# Patient Record
Sex: Male | Born: 1945
Health system: Southern US, Community
[De-identification: ages and names within clinical notes are randomized; demographics above are authoritative.]

## PROBLEM LIST (undated history)

## (undated) DIAGNOSIS — Z950 Presence of cardiac pacemaker: Secondary | ICD-10-CM

## (undated) DIAGNOSIS — R001 Bradycardia, unspecified: Secondary | ICD-10-CM

## (undated) DIAGNOSIS — K219 Gastro-esophageal reflux disease without esophagitis: Secondary | ICD-10-CM

## (undated) DIAGNOSIS — D649 Anemia, unspecified: Secondary | ICD-10-CM

## (undated) DIAGNOSIS — L719 Rosacea, unspecified: Secondary | ICD-10-CM

## (undated) DIAGNOSIS — I495 Sick sinus syndrome: Secondary | ICD-10-CM

## (undated) DIAGNOSIS — M5020 Other cervical disc displacement, unspecified cervical region: Secondary | ICD-10-CM

## (undated) DIAGNOSIS — E785 Hyperlipidemia, unspecified: Secondary | ICD-10-CM

## (undated) DIAGNOSIS — S62309A Unspecified fracture of unspecified metacarpal bone, initial encounter for closed fracture: Secondary | ICD-10-CM

## (undated) DIAGNOSIS — M1990 Unspecified osteoarthritis, unspecified site: Secondary | ICD-10-CM

## (undated) DIAGNOSIS — K449 Diaphragmatic hernia without obstruction or gangrene: Secondary | ICD-10-CM

## (undated) DIAGNOSIS — K220 Achalasia of cardia: Secondary | ICD-10-CM

## (undated) DIAGNOSIS — Z8719 Personal history of other diseases of the digestive system: Secondary | ICD-10-CM

## (undated) HISTORY — PX: MYOTOMY: SHX1013

## (undated) HISTORY — DX: Anemia, unspecified: D64.9

## (undated) HISTORY — DX: Personal history of other diseases of the digestive system: Z87.19

## (undated) HISTORY — DX: Gastro-esophageal reflux disease without esophagitis: K21.9

## (undated) HISTORY — PX: TONSILLECTOMY: SUR1361

## (undated) HISTORY — DX: Other cervical disc displacement, unspecified cervical region: M50.20

## (undated) HISTORY — PX: CERVICAL FUSION: SHX112

## (undated) HISTORY — PX: VARICOCELE EXCISION: SUR582

## (undated) HISTORY — DX: Diaphragmatic hernia without obstruction or gangrene: K44.9

## (undated) HISTORY — DX: Bradycardia, unspecified: R00.1

## (undated) HISTORY — DX: Rosacea, unspecified: L71.9

## (undated) HISTORY — PX: INSERT / REPLACE / REMOVE PACEMAKER: SUR710

## (undated) HISTORY — DX: Hyperlipidemia, unspecified: E78.5

## (undated) HISTORY — DX: Unspecified fracture of unspecified metacarpal bone, initial encounter for closed fracture: S62.309A

## (undated) HISTORY — PX: HERNIA REPAIR: SHX51

---

## 1975-11-27 HISTORY — PX: NISSEN FUNDOPLICATION: SHX2091

## 2001-02-23 ENCOUNTER — Encounter: Payer: Self-pay | Admitting: Emergency Medicine

## 2001-02-23 ENCOUNTER — Emergency Department (HOSPITAL_COMMUNITY): Admission: EM | Admit: 2001-02-23 | Discharge: 2001-02-23 | Payer: Self-pay | Admitting: Emergency Medicine

## 2002-05-25 ENCOUNTER — Encounter (INDEPENDENT_AMBULATORY_CARE_PROVIDER_SITE_OTHER): Payer: Self-pay | Admitting: Specialist

## 2002-05-25 ENCOUNTER — Ambulatory Visit (HOSPITAL_COMMUNITY): Admission: RE | Admit: 2002-05-25 | Discharge: 2002-05-25 | Payer: Self-pay | Admitting: Gastroenterology

## 2002-11-23 ENCOUNTER — Inpatient Hospital Stay (HOSPITAL_COMMUNITY): Admission: EM | Admit: 2002-11-23 | Discharge: 2002-11-25 | Payer: Self-pay | Admitting: Emergency Medicine

## 2002-11-23 ENCOUNTER — Encounter: Payer: Self-pay | Admitting: Emergency Medicine

## 2003-05-07 ENCOUNTER — Ambulatory Visit (HOSPITAL_COMMUNITY): Admission: RE | Admit: 2003-05-07 | Discharge: 2003-05-07 | Payer: Self-pay | Admitting: Gastroenterology

## 2003-05-07 ENCOUNTER — Encounter: Payer: Self-pay | Admitting: Gastroenterology

## 2003-12-09 ENCOUNTER — Inpatient Hospital Stay (HOSPITAL_COMMUNITY): Admission: EM | Admit: 2003-12-09 | Discharge: 2003-12-10 | Payer: Self-pay | Admitting: Emergency Medicine

## 2005-01-12 ENCOUNTER — Observation Stay (HOSPITAL_COMMUNITY): Admission: AD | Admit: 2005-01-12 | Discharge: 2005-01-13 | Payer: Self-pay | Admitting: *Deleted

## 2006-10-24 ENCOUNTER — Observation Stay (HOSPITAL_COMMUNITY): Admission: EM | Admit: 2006-10-24 | Discharge: 2006-10-25 | Payer: Self-pay | Admitting: Emergency Medicine

## 2006-10-24 ENCOUNTER — Ambulatory Visit: Payer: Self-pay | Admitting: Infectious Diseases

## 2007-06-10 ENCOUNTER — Encounter (HOSPITAL_COMMUNITY): Admission: RE | Admit: 2007-06-10 | Discharge: 2007-08-01 | Payer: Self-pay | Admitting: Gastroenterology

## 2007-09-10 ENCOUNTER — Encounter (HOSPITAL_COMMUNITY): Admission: RE | Admit: 2007-09-10 | Discharge: 2007-10-29 | Payer: Self-pay | Admitting: Gastroenterology

## 2007-11-15 ENCOUNTER — Observation Stay (HOSPITAL_COMMUNITY): Admission: EM | Admit: 2007-11-15 | Discharge: 2007-11-17 | Payer: Self-pay | Admitting: Emergency Medicine

## 2007-11-28 ENCOUNTER — Encounter: Admission: RE | Admit: 2007-11-28 | Discharge: 2007-11-28 | Payer: Self-pay | Admitting: General Surgery

## 2007-12-02 ENCOUNTER — Ambulatory Visit (HOSPITAL_BASED_OUTPATIENT_CLINIC_OR_DEPARTMENT_OTHER): Admission: RE | Admit: 2007-12-02 | Discharge: 2007-12-02 | Payer: Self-pay | Admitting: General Surgery

## 2010-12-05 ENCOUNTER — Inpatient Hospital Stay (HOSPITAL_COMMUNITY): Admission: EM | Admit: 2010-12-05 | Discharge: 2010-12-06 | Payer: Self-pay | Source: Home / Self Care

## 2010-12-06 ENCOUNTER — Encounter (INDEPENDENT_AMBULATORY_CARE_PROVIDER_SITE_OTHER): Payer: Self-pay

## 2010-12-06 ENCOUNTER — Encounter (INDEPENDENT_AMBULATORY_CARE_PROVIDER_SITE_OTHER): Payer: Self-pay | Admitting: Internal Medicine

## 2010-12-11 LAB — DIFFERENTIAL
Basophils Absolute: 0.1 10*3/uL (ref 0.0–0.1)
Basophils Relative: 1 % (ref 0–1)
Eosinophils Absolute: 0.1 10*3/uL (ref 0.0–0.7)
Eosinophils Relative: 2 % (ref 0–5)
Lymphocytes Relative: 18 % (ref 12–46)
Lymphs Abs: 1.1 10*3/uL (ref 0.7–4.0)
Monocytes Absolute: 0.5 10*3/uL (ref 0.1–1.0)
Monocytes Relative: 8 % (ref 3–12)
Neutro Abs: 4.3 10*3/uL (ref 1.7–7.7)
Neutrophils Relative %: 71 % (ref 43–77)
Smear Review: ADEQUATE

## 2010-12-11 LAB — CBC
HCT: 26.2 % — ABNORMAL LOW (ref 39.0–52.0)
HCT: 27.7 % — ABNORMAL LOW (ref 39.0–52.0)
Hemoglobin: 7.4 g/dL — ABNORMAL LOW (ref 13.0–17.0)
Hemoglobin: 8.5 g/dL — ABNORMAL LOW (ref 13.0–17.0)
MCH: 19.1 pg — ABNORMAL LOW (ref 26.0–34.0)
MCH: 21.4 pg — ABNORMAL LOW (ref 26.0–34.0)
MCHC: 28.2 g/dL — ABNORMAL LOW (ref 30.0–36.0)
MCHC: 30.7 g/dL (ref 30.0–36.0)
MCV: 67.7 fL — ABNORMAL LOW (ref 78.0–100.0)
MCV: 69.8 fL — ABNORMAL LOW (ref 78.0–100.0)
Platelets: 364 10*3/uL (ref 150–400)
Platelets: 292 10*3/uL (ref 150–400)
RBC: 3.87 MIL/uL — ABNORMAL LOW (ref 4.22–5.81)
RBC: 3.97 MIL/uL — ABNORMAL LOW (ref 4.22–5.81)
RDW: 18.5 % — ABNORMAL HIGH (ref 11.5–15.5)
RDW: 19.5 % — ABNORMAL HIGH (ref 11.5–15.5)
WBC: 6.1 10*3/uL (ref 4.0–10.5)
WBC: 5.7 10*3/uL (ref 4.0–10.5)

## 2010-12-11 LAB — COMPREHENSIVE METABOLIC PANEL
ALT: 21 U/L (ref 0–53)
AST: 17 U/L (ref 0–37)
Albumin: 3.4 g/dL — ABNORMAL LOW (ref 3.5–5.2)
Alkaline Phosphatase: 44 U/L (ref 39–117)
BUN: 19 mg/dL (ref 6–23)
CO2: 28 mEq/L (ref 19–32)
Calcium: 9 mg/dL (ref 8.4–10.5)
Chloride: 104 mEq/L (ref 96–112)
Creatinine, Ser: 0.99 mg/dL (ref 0.4–1.5)
GFR calc Af Amer: >60 mL/min (ref >60)
GFR calc non Af Amer: >60 mL/min (ref >60)
Glucose, Bld: 111 mg/dL — ABNORMAL HIGH (ref 70–99)
Potassium: 3.8 mEq/L (ref 3.5–5.1)
Sodium: 140 mEq/L (ref 135–145)
Total Bilirubin: 0.5 mg/dL (ref 0.3–1.2)
Total Protein: 6.5 g/dL (ref 6.0–8.3)

## 2010-12-11 LAB — LIPID PANEL
Cholesterol: 174 mg/dL (ref 0–200)
HDL: 59 mg/dL (ref >39)
LDL Cholesterol: 93 mg/dL (ref 0–99)
Total CHOL/HDL Ratio: 2.9 RATIO
Triglycerides: 109 mg/dL (ref <150)
VLDL: 22 mg/dL (ref 0–40)

## 2010-12-11 LAB — FERRITIN: Ferritin: 2 ng/mL — ABNORMAL LOW (ref 22–322)

## 2010-12-11 LAB — URINALYSIS, ROUTINE W REFLEX MICROSCOPIC
Bilirubin Urine: NEGATIVE
Hgb urine dipstick: NEGATIVE
Ketones, ur: NEGATIVE mg/dL
Nitrite: NEGATIVE
Protein, ur: NEGATIVE mg/dL
Specific Gravity, Urine: 1.014 (ref 1.005–1.030)
Urine Glucose, Fasting: NEGATIVE mg/dL
Urobilinogen, UA: 0.2 mg/dL (ref 0.0–1.0)
pH: 6.5 (ref 5.0–8.0)

## 2010-12-11 LAB — CROSSMATCH
ABO/RH(D): A POS
Antibody Screen: NEGATIVE
Unit division: 0
Unit division: 0

## 2010-12-11 LAB — POCT CARDIAC MARKERS
CKMB, poc: 2.4 ng/mL (ref 1.0–8.0)
Myoglobin, poc: 66.3 ng/mL (ref 12–200)
Troponin i, poc: 0.06 ng/mL (ref 0.00–0.09)

## 2010-12-11 LAB — PROTIME-INR
INR: 0.94 (ref 0.00–1.49)
Prothrombin Time: 12.8 seconds (ref 11.6–15.2)

## 2010-12-11 LAB — LIPASE, BLOOD: Lipase: 36 U/L (ref 11–59)

## 2010-12-11 LAB — IRON AND TIBC
Iron: 12 ug/dL — ABNORMAL LOW (ref 42–135)
UIBC: >450 ug/dL

## 2010-12-11 LAB — CARDIAC PANEL(CRET KIN+CKTOT+MB+TROPI)
CK, MB: 3 ng/mL (ref 0.3–4.0)
Relative Index: INVALID (ref 0.0–2.5)
Total CK: 75 U/L (ref 7–232)
Troponin I: 0.01 ng/mL (ref 0.00–0.06)

## 2010-12-11 LAB — CK TOTAL AND CKMB (NOT AT ARMC)
CK, MB: 3.9 ng/mL (ref 0.3–4.0)
Relative Index: INVALID (ref 0.0–2.5)
Total CK: 81 U/L (ref 7–232)

## 2010-12-11 LAB — BASIC METABOLIC PANEL
BUN: 19 mg/dL (ref 6–23)
CO2: 26 mEq/L (ref 19–32)
Calcium: 9.3 mg/dL (ref 8.4–10.5)
Chloride: 106 mEq/L (ref 96–112)
Creatinine, Ser: 1.01 mg/dL (ref 0.4–1.5)
GFR calc Af Amer: >60 mL/min (ref >60)
GFR calc non Af Amer: >60 mL/min (ref >60)
Glucose, Bld: 117 mg/dL — ABNORMAL HIGH (ref 70–99)
Potassium: 4.1 mEq/L (ref 3.5–5.1)
Sodium: 139 mEq/L (ref 135–145)

## 2010-12-11 LAB — URINE MICROSCOPIC-ADD ON

## 2010-12-11 LAB — BRAIN NATRIURETIC PEPTIDE: Pro B Natriuretic peptide (BNP): 36 pg/mL (ref 0.0–100.0)

## 2010-12-11 LAB — FOLATE: Folate: 15.2 ng/mL

## 2010-12-11 LAB — TROPONIN I: Troponin I: 0.01 ng/mL (ref 0.00–0.06)

## 2010-12-11 LAB — TSH: TSH: 1.317 u[IU]/mL (ref 0.350–4.500)

## 2010-12-11 LAB — VITAMIN B12: Vitamin B-12: 1025 pg/mL — ABNORMAL HIGH (ref 211–911)

## 2011-01-04 NOTE — Discharge Summary (Signed)
NAMEMarland Kitchen  Johnathan [name], Johnathan [name]            ACCOUNT NO.:  [id number]  MEDICAL RECORD NO.:  [id number]          [name] TYPE:  INP  LOCATION:  3703                         FACILITY:  [facility]  PHYSICIAN:  Baltazar Najjar, MD     DATE OF BIRTH:  Nov 16, 1946  DATE OF ADMISSION:  12/05/2010 DATE OF DISCHARGE:  12/06/2010                              DISCHARGE SUMMARY   FINAL DISCHARGE DIAGNOSES: 1. Chest pain secondary to severe anemia. 2. Symptomatic anemia.  Possibly secondary to gastrointestinal bleed. 3. History of Barrett's esophagus. 4. History of gastroesophageal reflux disease/erosive esophagitis. 5. History of hiatal hernia. 6. History of HLA disease status post Heller myomectomy.  PAST MEDICAL HISTORY:  Other than Johnathan above include: 1  Nissen fundoplication in 1977 with a take down in 1985. 1. Status right inguinal herniorrhaphy with mesh. 2. Degenerative disk disease status post cervical fusion. 3. Left variocele surgery. CONSULTATIONS DURING THIS HOSPITALIZATION: 1. Cardiology service.  Johnathan [name] was seen by Madolyn Frieze. Jens Som, MD,     [facility] for chest pain. 2. G.I.  Johnathan [name] seen by Petra Kuba, M.D. for symptomatic     anemia.  PROCEDURES DURING THIS HOSPITALIZATION:  EGD done today.  RADIOLOGICAL STUDIES:  None.  BRIEF ADMITTING HISTORY:  Please refer to Johnathan H and P dictated December 05, 2010.  In summary,  Johnathan [name] is a 65 year old man with history of Barrett's esophagus and erosive esophagitis, presented to Johnathan ER with chest discomfort with exertion.  HOSPITAL COURSE:  In Johnathan ED his EGD showed ST wave depression and with his workup revealed an anemia with hemoglobin of 7.4.  Johnathan [name] was admitted to telemetry floor.  Cardiac enzymes were cycled, and it was unremarkable.  Johnathan [name] seen by cardiology service; an echocardiogram was done and no further workup was recommended by cardiology and they recommended an outpatient Myoview after anemia is  corrected and Johnathan [name] is stabilized.  Johnathan [name] had an EGD done by Dr. Ewing Schlein today that showed multiple gastric polyps and distal esophageal ulcers, status post biopsies Dr. Ewing Schlein recommended outpatient follow-up for possible colonoscopy and he also stated that he will consider placing Johnathan [name] on Reglan and erythromycin, and as an outpatient.  Will also consider gastric emptying studies as an outpatient.  He recommended to DC Johnathan [name] on iron supplementation.  Johnathan [name] seen on exam by me today and he is stable for discharge home.  He received blood transfusion during this hospitalization and his hemoglobin stabilizer at 8.  He received 2 units of blood transfusion on December 05, 2010   DISCHARGE MEDICATIONS: 1. Ferrous sulfate 325 mg p.o. b.i.d. 2. Omeprazole 40 mg p.o. b.i.d. 3. Vitamin B, vitamin C and vitamin D over-Johnathan-counter 1 tablet daily. 4. Zinc over-Johnathan-counter 1 tablet daily.  I stopped his vitamin B     complex because his anemia panel showed an elevated vitamin B     level.  DISCHARGE INSTRUCTIONS: 1. Johnathan [name] to continue above medications as prescribed. 2. Johnathan [name] to follow with Dr. Ewing Schlein as instructed, for him to call     Johnathan office in 2-3 weeks for  appointment.  Johnathan [name] also to     follow with cardiology Dr Madolyn Frieze. [place] for outpatient Myoview     tests after his anemia stabilized.  CONDITION ON DISCHARGE:  Stable.          ______________________________ Baltazar Najjar, MD     SA/MEDQ  D:  12/06/2010  T:  12/06/2010  Job:  [id number]  cc:   Deatra James, M.D. Petra Kuba, M.D. Madolyn Frieze Jens Som, MD, [facility]  Electronically Signed by Baltazar Najjar MD on 01/04/2011 08:03:58 AM

## 2011-04-10 NOTE — Op Note (Signed)
NAME:  Johnathan [name], Johnathan [name]            ACCOUNT NO.:  [id number]   MEDICAL RECORD NO.:  [id number]          PATIENT TYPE:  AMB   LOCATION:  NESC                         FACILITY:  [facility]   PHYSICIAN:  Anselm Pancoast. Weatherly, M.D.DATE OF BIRTH:  Apr 15, 1946   DATE OF PROCEDURE:  12/02/2007  DATE OF DISCHARGE:                               OPERATIVE REPORT   PREOPERATIVE DIAGNOSIS:  Right inguinal hernia.   POSTOPERATIVE DIAGNOSIS:  Right inguinal hernia, direct.   OPERATION:  Right inguinal herniorrhaphy, with mesh reinforcement,  Lichtenstein repair.   ANESTHESIA:  General.   SURGEON:  Anselm Pancoast. Zachery Dakins, M.D.   ASSISTANT:  Nurse.   HISTORY:  The patient is a 65 year old Caucasian male, who is seeing me  for his symptomatic right inguinal hernia.  The patient has a history of  achalasia, and he has had numerous esophageal surgical procedures, and  is kind of chronically anemic.  His hematocrit today is 29, which is  good for him.  Thereafter, he was given a gram of Ancef, and I elected  to do him with general anesthesia, we are going to use the endotracheal  tube because of his achalasia.  Induction of general anesthesia via the  groin, which had been marked, was first clipped and then prepped with  Betadine solution and draped in the sterile manner.  The inguinal  incisional area was then infiltrated with a mixture of 1% Marcaine with  0.5% Xylocaine, and a blunted 21-gauge, 2-gauge needle, was used to  infiltrate the ilioinguinal nerve area.  Sharp dissection at the skin,  subcutaneous tissue, and Scarpa fascia with two superficial veins that  were clamped, divided, and ligated with fine Vicryl and then the  external oblique aponeurosis opened.  The cord structures were elevated  and at first you could not really tell whether this was going to be an  indirect or direct inguinal hernia, but with elevating the cord  structures and kind of carefully separating the hernia from the  cord  structures, you see it comes medial to the epigastric vessels.  I opened  the transversalis which it had thinned out so I could reduce the hernia  back in the preperitoneal cavity and then sutured starting at the  symphysis pubis.  The floor with a running 2-0 Prolene, reconstructed  the internal ring, and then went back suturing the two ends together.  Then, a piece of mesh cut like a sail slip laterally was used to  reinforce the floor.  This was sutured to the inferior inguinal shelving  edge with a running 2-0 Prolene.  The two tails around the newly created  internal ring were sutured together laterally.  The ilioinguinal nerve  together with the cord structure and the ilio-epigastric is fine up  superior so that the mesh is not like overlying it.  The superior flap  was sutured down with some interrupted 2-0 Prolene, some 2-0 Vicryl, and  its line provided the tension.  I did put anesthetic solution in the  floor and then the external oblique was closed with a running 3-0  Vicryl.  The Scarpa fascia was  closed with interrupted 4-0 Vicryl, 4-  0 Vicryl subcuticular, benzoin and Steri-Strips on the skin.  The  testicle was in its normal position.  There was minimal bleeding and the  patient will be released after a short stay in the recovery room.  I  will use Vicodin for pain, and he will continue on his chronic  medications.           ______________________________  Anselm Pancoast. Zachery Dakins, M.D.     WJW/MEDQ  D:  12/02/2007  T:  12/02/2007  Job:  [id number]   cc:   Anselm Pancoast. Zachery Dakins, M.D.  [address]  [place]  [place] [address]   Archie Balboa  Fax: [phone]

## 2011-04-10 NOTE — Discharge Summary (Signed)
NAME:  Johnathan [name], Johnathan [name] NO.:  [id number]   MEDICAL RECORD NO.:  [id number]          PATIENT TYPE:  INP   LOCATION:  3031                         FACILITY:  [facility]   PHYSICIAN:  Tasia Catchings, M.D.   DATE OF BIRTH:  05-Apr-1946   DATE OF ADMISSION:  11/15/2007  DATE OF DISCHARGE:  11/17/2007                               DISCHARGE SUMMARY   DISCHARGE DIAGNOSES:  1. Anemia secondary to erosive esophagitis.  2. Gastroesophageal reflux disease.  3. Achalasia status post Heller myotomy and Nissen which was removed.  4. Barrett's esophagus last biopsy in 2007.  5. Depression.   DISCHARGE MEDICATIONS:  1. Prilosec 40 mg q.a.m.  2. NuIron 2 tablets q.h.s.   DISCHARGE INSTRUCTIONS:  1. Follow up to see me in my office on January 2.  2. He is on an unrestricted diet.  3. Activity is unrestricted.  4. Conditions are improved.   HISTORY:  Johnathan [name] is a 65 year old patient who I know quite well.  He has chronic anemia from upper GI blood loss due to inflammation of  his lower esophagus.  This comes by a Heller myotomy which was done for  treatment of achalasia and an incidental Nissen fundoplication which had  to be removed.  As a result, it has now produced Barrett's esophagus.  He has been evaluated here at [facility], and he saw Dr. Marylouise Stacks at  [facility] in [place], and there was really nothing  more to be done except surveillance biopsies which will be due again in  2010 and PPI therapy which he is intermittently noncompliant.   His current admission was prompted by the fact that he was feeling  weaker, had seen some black stools and claims to have been taking his  Prilosec and avoiding NSAIDS.  On admission in the emergency room, he  was discovered to have a hemoglobin of 7.6.  We had been doing serial  hemoglobins every month, and he skipped November, and that may be why he  dropped down without our knowing.   PHYSICAL EXAMINATION:   Unremarkable.   LABORATORY DATA:  Hemoglobin initially 7.6, after transfusion 9.6.  Chemistries were normal.  Iron was low, folic acid and B12 normal.   HOSPITAL COURSE:  The patient was placed at bedrest on IV fluids and  given 2 units of packed cells.  He was given some Lasix in between and  dropped his potassium to 2.9, but this was corrected.  He had an  otherwise unremarkable hospitalization, and is discharged on the above  medications.      Tasia Catchings, M.D.  Electronically Signed     JW/MEDQ  D:  11/17/2007  T:  11/17/2007  Job:  [id number]

## 2011-04-10 NOTE — H&P (Signed)
NAME:  Johnathan [name], Johnathan [name]            ACCOUNT NO.:  [id number]   MEDICAL RECORD NO.:  [id number]          PATIENT TYPE:  INP   LOCATION:  3031                         FACILITY:  [facility]   PHYSICIAN:  Deirdre Peer. Polite, M.D. DATE OF BIRTH:  12/08/1945   DATE OF ADMISSION:  11/15/2007  DATE OF DISCHARGE:                              HISTORY & PHYSICAL   CHIEF COMPLAINT:  Weak, dizzy, history of anemia.   HISTORY OF PRESENT ILLNESS:  A 65 year old male with known history of  achalasia, iron deficiency anemia comes to the ED with complaint of  feeling weak, dizziness.  Denies any nausea or vomiting.  No diarrhea.  No constipation.  Did notice some dark stools off and on.  Typically,  this is his presentation when he has symptomatic anemia.  In the ED, he  was evaluated.  Hemoglobin initially reported at 6.9 followed by 8.2.  The patient was deemed necessary for further evaluation and treatment.   PAST MEDICAL HISTORY:  As stated above.   CURRENT MEDICATIONS:  Nexium 40 mg daily.   SOCIAL HISTORY:  Negative for tobacco, alcohol or drugs.   ALLERGIES:  NONE.   PAST SURGICAL HISTORY:  Patient stated he had a Nissen fundoplication in  the past, multiple surgeries for his achalasia, cervical fusion in the  past, varicocele surgery in the past.   FAMILY HISTORY:  Father with diabetes, CHF.  Mother with breast CA.   REVIEW OF SYSTEMS:  As stated in the HPI.   PHYSICAL EXAMINATION:  GENERAL:  Patient is alert and oriented times  three, no apparent distress.  VITAL SIGNS:  Temp 97.7, pulse 52, BP 115/48, sat 98%.  HEENT:  Significant for pale sclerae.  Otherwise within normal limits.  CHEST:  Clear without rales and rhonchi.  CARDIOVASCULAR:  Regular S1, S2.  No S3 appreciated.  ABDOMEN:  Soft, non-tender.  No hepatosplenomegaly.  EXTREMITIES:  Without edema.  NEURO:  Non-focal.   ASSESSMENT/PLAN:  Symptomatic anemia in patient with known history of  iron deficient anemia with need for  recurrent transfusions.  Patient  will be admitted for 24-hour observation.  Type and cross for 2 units of  packed red blood cells.  Will have a post transfusion H and H.  Will  continue PPI.  If H and H is stable post transfusion probably will  discharge in the morning.      Deirdre Peer. Polite, M.D.  Electronically Signed     RDP/MEDQ  D:  11/15/2007  T:  11/17/2007  Job:  [id number]   cc:   Tasia Catchings, M.D.

## 2011-04-13 NOTE — Consult Note (Signed)
NAME:  Johnathan [name], Johnathan [name] NO.:  [id number]   MEDICAL RECORD NO.:  [id number]          PATIENT TYPE:  INP   LOCATION:  3313                         FACILITY:  [facility]   PHYSICIAN:  Tasia Catchings, M.D.   DATE OF BIRTH:  30-Apr-1946   DATE OF CONSULTATION:  10/25/2006  DATE OF DISCHARGE:                                 CONSULTATION   REQUESTING PHYSICIAN:  Fransisco Hertz, M.D.   Johnathan [name] is a 65 year old patient well known to me who came in to  the hospital yesterday with weakness.  This is a common complaint for  him on an intermittent basis and usually indicates that his hemoglobin  has dropped below 8.  In the past several years, he has had 4-5 similar  admissions.   The etiology of his anemia appears to be chronic inflammation in his  distal esophagus.  About 30 years ago, he had achalasia, and I do not  have at my disposal all of the specific information, but the diagnosis  was made either in [place] or in [place].  He ended up having a  Nissen fundoplication, either done before or after, and it would make  more sense if it was done after a Heller myotomy.  Unfortunately, he  continued to have dysphagia and as a result, the Heller was left open,  but the Nissen was brought down.  He therefore has an enlarged esophagus  with almost no peristaltic contractions, a widely patent  gastroesophageal junction, and consequently, reflux of gastric contents  into his esophagus.  To treat this, he is supposed to be taking proton  pump inhibitors as well as Sucofate.  His last endoscopy and colonoscopy  occurred in 2003 and revealed, from the upper standpoint, an enlarged  esophagus with erosions and hemorrhagic distal esophagitis but no  evidence of Barrett's esophagus or stricture.  Distal to the EG junction  were several small fundic polyps, which were biopsied but were clearly  benign, and an otherwise normal stomach and duodenum.  His colonoscopy  was entirely  normal.   Since then, he has been noncompliant in terms of really strong followup.  On occasion, he will take a PPI, but he is worried considerably about  their side effects, even though he has never had any of their side  effects.  He has not taken his Carafate, and he has recently sought help  with a herbalist physician in [facility], who has him on several  supplements.   Prior to his admission, he did not have black stools, nor did he have  any nausea and vomiting and specifically no hematemesis.  It is unclear  whether he has had a hemoglobin since he left the hospital last year  when he came in again with low hemoglobin and received 2 units of blood.  His symptoms began a week or so before he showed up in the emergency  room yesterday.  He denies dysphagia and at this point is feeling  better, having been transfused.   PHYSICAL EXAMINATION:  GENERAL:  A thin white male in no acute distress.  LUNGS:  Clear.  HEART:  Heart tones are normal.  ABDOMEN:  Scaphoid and nontender.   Laboratory data reveals a hemoglobin of 7 and is otherwise unremarkable.   IMPRESSION:  This patient's anemia comes from gastrointestinal blood  loss from the distal esophagus due to chronic reflux of gastric contents  as a result of a Heller myotomy and a failed Nissen fundoplication.  It  is aggravated by the fact that he has never received IV iron, even  though it has been recommended, nor has he been compliant in taking his  medications.   RECOMMENDATIONS:  I think he needs IV iron before he leaves the  hospital.  I will plan to tell this verbally to the teaching service.  I  think he also needs an endoscopy, which probably cannot be arranged  until next week and therefore can be done as an outpatient.  We will  look for evidence of Barrett's esophagus.  Finally, I had a long talk  with him today regarding the compliance issue and the need for him to  take the medications regularly and to have  regular followup.  I also  offered him a visit to one of the tertiary teaching hospitals here or  perhaps down in [place], [place], where Marylouise Stacks would be a  person for him to see.  We talked about more surgery, none of which he  is interested in, and the consequences of not taking the medicine, which  is not just his anemia, which he is currently suffering from, but the  consequences of anemia, which get worse as he grows older.      Tasia Catchings, M.D.  Electronically Signed     JW/MEDQ  D:  10/25/2006  T:  10/26/2006  Job:  [id number]

## 2011-04-13 NOTE — H&P (Signed)
NAME:  Johnathan [name], Johnathan [name]                      ACCOUNT NO.:  [id number]   MEDICAL RECORD NO.:  [id number]                   PATIENT TYPE:  EMS   LOCATION:  MAJO                                 FACILITY:  [facility]   PHYSICIAN:  Corinna L. Lendell Caprice, MD             DATE OF BIRTH:  March 14, 1946   DATE OF ADMISSION:  12/09/2003  DATE OF DISCHARGE:                                HISTORY & PHYSICAL   CHIEF COMPLAINT:  Anemia.   HISTORY OF PRESENT ILLNESS:  Mr. Arvelo is a 65 year old white male  patient of Dr. Leodis Sias, who was seen in Dr. Nash Dimmer office today with a  history of malaise, weakness, shortness of breath with exertion.  He also  has been having some black stools.  He was found to have a hemoglobin of  about 9 in the office.  His last hemoglobin was 12 in December, according to  Dr. Modesto Charon.  The rectal exam showed dark stools which were heme positive in  the office.  Arrangements were attempted at direct admission, but there were  no beds, so patient was seen by me in the emergency room.  He reports that  he has not had melenic stool, per say, although it has been black.  He has  mainly had problems with constipation.  He has a history of severe  gastroesophageal reflux disease and resulting erosive esophagitis.  He also  has achalasia.  He has had problems with bleeding from his esophagitis in  the past and has required transfusion.  He sees Dr. Sherin Quarry for periodic  EGD and esophageal dilatation.  He has had myotomy in the past and reports  two other surgeries.  I am not sure what exact procedures were done.  He has  not been terribly compliant with his iron or proton pump inhibitor.  He had  been taking 40 mg of Prilosec but felt that this was causing him severe  constipation, so he tapered himself down to 20 mg and subsequently stopped  it altogether.  He feels that his reflux is getting worse, but he has more  trouble with the constipation.  For these reasons, he also  stopped his iron  supplementation.  He has been taking some over-the-counter herbal  supplements for constipation but cannot recall what they are called or what  are their ingredients.  He has felt occasionally lightheaded.  He feels  short of breath when he walks up stairs.   PAST MEDICAL HISTORY:  As above.  He also was noted to have iron-deficiency  anemia in the past.   MEDICATIONS:  Essentially none except for unknown herbal supplements.  He  denies nonsteroidal anti-inflammatories.   SOCIAL HISTORY:  Patient is a Investment banker, operational.  He does not drink or smoke.   FAMILY HISTORY:  Noncontributory.   REVIEW OF SYSTEMS:  As above.  Otherwise, system review is negative.   PHYSICAL EXAMINATION:  VITAL SIGNS:  Temperature 98.4, blood  pressure  116/70, pulse 74, respiratory rate 20.  Oxygen saturation 98%.  GENERAL:  Patient is well-developed and well-nourished in no acute distress.  HEENT:  Normocephalic and atraumatic.  Pupils are equal, round and reactive  to light.  Sclerae are anicteric.  He has slightly pale conjunctivae.  Moist  mucous membranes.  No thrush.  Oropharynx is clear.  NECK:  Supple.  No lymphadenopathy.  Trachea is midline.  LUNGS:  Clear to auscultation without wheezes, rales or rhonchi.  HEART:  Regular rate and rhythm without murmurs, gallops or rubs.  ABDOMEN:  Soft, nontender, nondistended.  GU:  Deferred.  RECTAL:  As per Dr. Modesto Charon.  See above.  EXTREMITIES:  No clubbing, cyanosis or edema.  Pulses are intact.   LABS:  CBC is significant for a hemoglobin of 9.3, hematocrit 27.5, MCV 83.  The rest of his CBC is normal.  PT is 25.  INR is 0.9.  Complete metabolic  panel is pending.  There is a hemoglobin on record during one of his  previous hospitalizations in 2003 where his hemoglobin was about 9.   ASSESSMENT/PLAN:  1. Symptomatic anemia with hemoccult-positive stools and history of erosive     esophagitis causing bleeding.  This is most likely a recurrence of the      bleeding from the esophagitis.  He has been noncompliant with his     medications.  I have encouraged him to try proton pump inhibitors.  He     agrees to do so.  He has been intolerant of Protonix in the past and felt     that it made his abdominal pain worse.  I will try Nexium.  I will check     serial hemoglobin levels.  If his hemoglobin drops much further, he will     be transfused.  He will be typed and screened with 2 units of packed red     blood cells.  Will check ferritin and most likely have to resume the iron     as an outpatient.  Given most of his noncompliance is due to     constipation, I will start him on Colace.  2. Achalasia.  3. Gastroesophageal reflux disease.                                                Corinna L. Lendell Caprice, MD    CLS/MEDQ  D:  12/09/2003  T:  12/09/2003  Job:  [id number]   cc:   Maryla Morrow. Modesto Charon, M.D.  [address]  [place]  [place] [address]  Fax: [phone]   Tasia Catchings, M.D.  [address]  [place]  [place] [address]  Fax: [phone]

## 2011-04-13 NOTE — Discharge Summary (Signed)
NAME:  Johnathan [name], Johnathan [name]            ACCOUNT NO.:  [id number]   MEDICAL RECORD NO.:  [id number]          PATIENT TYPE:  INP   LOCATION:  5152                         FACILITY:  [facility]   PHYSICIAN:  Theone Stanley, MD   DATE OF BIRTH:  [birth year]   DATE OF ADMISSION:  01/12/2005  DATE OF DISCHARGE:                                 DISCHARGE SUMMARY   ADMITTING DIAGNOSES:  1.  Anemia.  2.  History of chronic gastrointestinal loss from erosive esophagitis.  3.  History of severe gastroesophageal reflux disease and erosive      esophagitis.  4.  Achalasia.  5.  Iron deficiency anemia.   DISCHARGE DIAGNOSES:  1.  Anemia, most likely secondary to erosive esophagitis.  2.  History of chronic gastrointestinal loss from erosive esophagitis.  3.  History of severe gastroesophageal reflux disease and erosive      esophagitis.  4.  Achalasia.  5.  Iron deficiency anemia.   CONSULTATIONS:  None.   PROCEDURES:  None.   HOSPITAL COURSE:  [name] is a very pleasant 65 year old gentleman  with a history of erosive esophagitis and anemia, presenting with increased  malaise, weakness, and shortness of breath with exertion.  He has also  noticed intermittent tarry stools.  The patient has a history of this, and  in fact as recently as December 08, 2004 he was in the hospital for a  transfusion.  On presentation to the office, it was noted that his blood  level as 7.3, and previous one was 9.3.  Because of his symptomatic  presentation with the drop in his blood level, the patient was admitted for  transfusion.  He received a total of 2 units, and his hemoglobin came up to  9.3.  The patient's symptoms are relieved.  He is no longer short of breath.  He is able to walk the halls without any difficulty.  Because of his known  erosive esophagitis and the fact that he has a GI doctor, Dr. Sherin Quarry, it  was felt that it was not necessary to do a scope here within the hospital,  and he is to  follow up with Dr. Sherin Quarry after getting out from the hospital  to set up an EGD.   DISCHARGE MEDICATIONS:  1.  Nexium 40 mg one p.o. b.i.d.  2.  Vitamin C.  3.  Vitamin D.  4.  Vitamin E.  5.  Folic acid.  6.  Ginkgo biloba.  7.  Co-Q10.  8.  Calcium.  9.  Magnesium.  10. Selenium.  11. Zinc.  12. Glucosamine.   FOLLOWUP:  The patient is to follow up with Dr. Sherin Quarry in three to five  days with a CBC, and Dr. Modesto Charon, his primary care, on a p.r.n. basis.      AEJ/MEDQ  D:  01/13/2005  T:  01/15/2005  Job:  [id number]   cc:   Tasia Catchings, M.D.  [address]  [address]  [place]  [place] [address]  Fax: [phone]   Maryla Morrow. Modesto Charon, M.D.  [address]  [place]  [place] [address]  Fax: [phone]

## 2011-04-13 NOTE — H&P (Signed)
NAME:  JERAL, ZICK NO.:  [id number]   MEDICAL RECORD NO.:  [id number]          PATIENT TYPE:  INP   LOCATION:  5152                         FACILITY:  [facility]   PHYSICIAN:  Loyola Mast, MD       DATE OF BIRTH:  April 30, 1946   DATE OF ADMISSION:  01/12/2005  DATE OF DISCHARGE:                                HISTORY & PHYSICAL   CHIEF COMPLAINT:  Fatigue.   HISTORY OF PRESENT ILLNESS:  Mr. Conant is a 64 year old Caucasian male  with a known history of erosive esophagitis and achalasia with iron  deficiency anemia secondary to presumed upper GI bleeding.  The patient was  admitted last year to Dr. Jamison Neighbor service for similar complaints  and was found to be anemic and transfused one unit of blood.  The year  prior, he was transfused 2 units of blood in the setting of chest pain.   The patient's last EGD and colonoscopy were in June 2004.  His upper  endoscopy was associated with a dilation.  There is no evidence for the  patient of Barrett's.  His colonoscopy did reveal polyps but no evidence of  ulceration, diverticula, or obvious bleeding lesions.  At this point, the  patient has not been on iron supplementation due to associated side effects  of constipation.  He does take a daily proton pump inhibitor.  In terms of  other symptoms, the patient complains of respiratory symptoms in terms of  postnasal drip and cough but otherwise denies any hematemesis.  In terms of  GI symptoms, he does have occasional dark tinged stools which may be melena.  He also has a recent rash which is being treated by his primary care  physician.   PAST MEDICAL HISTORY:  Significant for erosive esophagitis and achalasia.  As stated above, he underwent upper endoscopy with dilation in June 2004 by  Dr. Sherin Quarry.   PAST SURGICAL HISTORY:  The patient underwent a Nissen fundoplication in  1977.  He subsequently was found to have achalasia and underwent a myotomy.  He  underwent a takedown of his Nissen in 1985.   Since that time, he has been on and off of reflux medications and currently  is on a proton pump inhibitor once daily.   ALLERGIES:  PROTONIX which actually worsened his GERD.   MEDICATIONS:  1.  Nexium 40 daily.  2.  Co-Q10.  3.  Glucosamine.  4.  Selenium ALA.  5.  Magnesium.  6.  Calcium.  7.  B supplements.   FAMILY HISTORY:  His mother died of heart failure and had dementia.  His  father had diabetes and coronary artery disease.   SOCIAL HISTORY:  The patient is originally from [place].  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  He does not smoke.  He  does not drink.  He has no children.  He is not married.   REVIEW OF SYSTEMS:  See the history of present illness.   PHYSICAL EXAMINATION:  VITAL SIGNS:  Temperature is 98.7.  Pulse is 65.  Blood  pressure is 122/72.  Respirations are 22, and he is saturating 98% on  room air.  GENERAL:  This is a thin male who appears his stated age.  HEENT:  He has evidence of conjunctival pallor.  Otherwise, there is no  evidence of icterus.  His __________ are less than 10 cm of water pressure.  CARDIAC:  He has a regular rate and rhythm.  There is a holosystolic murmur  present at the apex.  There is no gallop.  LUNGS:  Clear without dullness to percussion.  ABDOMEN:  Soft and nontender without organomegaly.  Bowel sounds are  present.  EXTREMITIES:  Warm and well-perfused without cyanosis, clubbing, or edema.  His capillary refill was less than 2 seconds.  NEUROLOGIC:  There is no evidence of focal deficit.   LABORATORY DATA:  As an outpatient, he apparently had a hemoglobin of 7 with  a hematocrit of 24.7.  His prior hemoglobin was 11, and last documented  hemoglobin here at the hospital was 9.3.   ASSESSMENT AND PLAN:  1.  Anemia, likely secondary to occult gastrointestinal bleeding.  At this      point, the patient does not have an urgent need for endoscopy.       Nevertheless, he is significantly symptomatic due to his anemia and      complains of dyspnea on exertion, fatigue, and tachycardia while      climbing stairs.  I have increased his proton pump inhibitors to twice a      day.  I will transfuse 2 units of packed red blood cells with a      posttransfusion CBC in the morning.  Otherwise, the patient does not      have a known history of Barrett's.  Nonetheless, the patient would      benefit from a repeat endoscopy (1) To evaluate for Barrett's.  (2) To      evaluate for presumed recurrence esophageal stricture or worsening of      his achalasia.  At this point, the patient states that food is getting      stuck more often, and his technique of using warm water is not working.      He often has to vomit in order to dislodge food that may be trapped in      his esophagus.  2.  Cough.  This is likely secondary to a post upper respiratory illness.      Treat him empirically with Allegra and Tessalon Perles.  The patient      denied codeine.      JMJ/MEDQ  D:  01/12/2005  T:  01/12/2005  Job:  [id number]

## 2011-04-13 NOTE — Discharge Summary (Signed)
NAME:  Johnathan [name], Johnathan [name]            ACCOUNT NO.:  [id number]   MEDICAL RECORD NO.:  [id number]          PATIENT TYPE:  INP   LOCATION:  3313                         FACILITY:  [facility]   PHYSICIAN:  Fransisco Hertz, M.D.  DATE OF BIRTH:  November 16, 1946   DATE OF ADMISSION:  10/24/2006  DATE OF DISCHARGE:  10/25/2006                               DISCHARGE SUMMARY   DISCHARGE DIAGNOSES:  1. Iron deficiency, anemia.  2. Achalasia.  3. Gastroesophageal reflux disease.  4. Erosive esophagitis.   DISCHARGE MEDICATIONS:  1. Nexium 40 mg twice a day.  2. Iron 325 mg 3 times a day.   DISPOSITION/FOLLOWUP:  Patient was discharged from the hospital where he  received intravenous iron for a iron deficiency anemia.  Hemoglobin, on  admission, was 7.0 and improved to 9.0 after transfusion of 2 units.  Patient was then given intravenous iron to increase his iron stores and  stabilize his anemia.  He was seen by Dr. Sherin Quarry from [facility], who is  his gastroenterologist.  At that time, Dr. Sherin Quarry scheduled for the  patient on October 28, 2006 at 11 a.m. for followup of anemia, CBC, as  well as endoscopy to look for Barrett's esophagus.  Patient was to  schedule his own appointment with primary care physician, Dr. Archie Balboa, from [facility] for followup of anemia as well.  Patient was  encouraged to be adherent with all medications, especially his iron  medication.   PROCEDURES PERFORMED:  None.   CONSULTATIONS:  Patient was seen by Dr. Sherin Quarry from [facility], who  scheduled patient for a followup for endoscopy on November 28, 2005 at 11  a.m.   BRIEF ADMISSION HISTORY AND PHYSICAL:  Patient is a 65 year old man with  a past medical history of achalasia, GERD and erosive esophagitis who  came in with a chief complaint of fatigue.  The last 3-5 days, he felt  his blood was low and that he felt very fatigue, tingling sensations  in his upper extremities, as well as his heart beating fast.   He also  complained of occasional dizziness upon standing.  Patient came to the  ED, his hemoglobin was found to be 7.0.  Patient states that he  experiences reflux at night that has gotten progressively worse over the  last month and characterizes it as a burning, rumbling sensation.  Eating alleviate the pain.  No aggravating factors.  Patient denies any  difficulties swallowing or pain with swallowing.  He denies any blood in  his sputum or coughing up of blood.  He denies any blood in his stools  or any black tarry stools.  Patient was originally diagnosed with  erosive esophagitis and GERD approximately 3 years ago.  His last  hospitalization for similar complaints was in February of 2006, where he  received blood transfusion of 2 units of packed red blood cells.  His  last endoscopy and colonoscopy was in 2004, which showed a large  esophagus, distal reflux esophagitis and colonoscopy was normal.   ADMISSION VITAL SIGNS:  Temperature 97.3.  Blood pressure 118/66.  Pulse  63.  Respirations 20.  Oxygen saturation 100% on room air.  Weight 68.1  kilograms.   PHYSICAL EXAMINATION:  GENERAL:  This was a healthy-appearing, pale,  thin man in no acute distress.  HEENT:  Eyes:  He had pale conjunctivae bilaterally.  Oropharynx was  clear with no erythema or exudate.  Moist mucous membranes.  NECK:  Supple.  RESPIRATORY:  Lungs were clear to auscultation bilaterally.  No crackles  or wheezes and good inspiratory effort.  CARDIOVASCULAR:  Regular rate and rhythm.  No murmur.  No bruit.  GI:  Abdomen was soft, nontender throughout all quadrants, nondistended  with normoactive bowel sounds.  EXTREMITIES:  No evidence of cyanosis, clubbing or edema.  SKIN:  Skin was pale, but with no rashes or lesions.  No palpable  lymphadenopathy.  NEURO:  Patient was alert and oriented x3.  Cranial nerves II-XII were  intact with no focal deficits.  Strength was 5/5 bilateral upper  extremities and  lower extremities to both flexion and extension.   ADMISSION LABORATORY DATA:  Orthostatics were obtained.  Lying, the  patient's blood pressure was 102/42 with a pulse of 47.  Sitting, blood  pressure was 114/61 with a pulse of 56.  Standing, blood pressure was  115/68 with a pulse of 50.  Sodium 139, potassium 3.8, chloride 107,  bicarb 25.6, BUN 22, creatinine of 1.0 and blood glucose of 113.  White  blood cell count was 5.9, hemoglobin 7.1, hematocrit 22.6 and platelets  of 466.  MCV was 69.4.  Cardiac enzymes were obtained with a CK-MB of  2.3, troponin-I of 0.05 and myoglobin 44.7.  His stool was hemoccult  positive.  Ferritin was 5.  Vitamin B12 was 1095.  Lipid panel:  Cholesterol 156, triglycerides 51, HDL 65, LDL 81.   HOSPITAL COURSE:  1. Iron deficiency anemia:  Patient's hemoglobin increased to 9.0 from      7.1 after transfusion of 2 units of packed red blood cells.  He was      started on iron 325 mg 3 times a day.  He was also given      intravenous iron.  He was given a 25 mg IV test dose, followed by      500 mg IV infusion over the next 3 hours.  Because of his low      hemoglobin, GI was consulted as patient was considered to be      actively, slowly bleeding.  He was seen by Dr. Sherin Quarry from [facility].  2. GERD/erosive esophagitis:  Patient was started on Nexium 40 mg 2      times a day.  He was seen by Dr. Sherin Quarry from [facility] and was      scheduled for an appointment as an outpatient for endoscopy to look      for Barrett's esophagus.   DISPOSITION:  Patient was discharged to home with a follow up with Dr.  Sherin Quarry and his primary care physician, Dr. Archie Balboa.  It was  stressed to patient his need for medication compliance.   DISCHARGE LABORATORY DATA:  Hemoglobin 9.0, this was before the IV iron,  hematocrit 27.7, sodium 140, potassium 3.6, chloride 106, bicarb 28, BUN  14, creatinine 0.9, blood glucose 109, calcium 8.7.  White blood  cell count 6.8, platelets 398.      Peggye Pitt, M.D.  Electronically Signed      Fransisco Hertz, M.D.  Electronically Signed  EH/MEDQ  D:  10/27/2006  T:  10/28/2006  Job:  [id number]   cc:   Tasia Catchings, M.D.  Archie Balboa

## 2011-04-13 NOTE — Consult Note (Signed)
NAME:  Johnathan [name], SWINDLE                      ACCOUNT NO.:  [id number]   MEDICAL RECORD NO.:  [id number]                   PATIENT TYPE:  INP   LOCATION:  3706                                 FACILITY:  [facility]   PHYSICIAN:  Francisca December, M.D.               DATE OF BIRTH:  1946/02/22   DATE OF CONSULTATION:  11/25/2002  DATE OF DISCHARGE:  11/25/2002                                   CONSULTATION   CARDIOLOGY CONSULTATION:   PHYSICIANS:  1. Primary care Kaleena Corrow is Dr. Maryla Morrow. Modesto Charon.  2. Hospitalist is Dr. Sherin Quarry.   IMPRESSION:  1. Chest pain with typical/atypical features of angina pectoris in this 73-     year-old gentleman with positive family history for late-in-life coronary     artery disease, unknown cholesterol profile setting of anemia with     hemoglobin 8.1.  (Probable gastrointestinal bleed).  A stress Cardiolite     was suspicious for ischemia territory of anterior/apical region; normal     ejection fraction.  His treadmill portion of the test was unremarkable.     The patient did rule out for myocardial infarction by negative serial     cardiac enzymes; negative congestive heart failure by congestive heart     failure and nonischemic EKG.  2. Anemia.  Probable gastrointestinal bleed.  Guaiac-positive stools.  He     does have known erosive distal esophagitis by EGD June 2003; colonoscopy     at that time was okay.  His admission hemoglobin was 8.1; 9.1 status post     transfusion of 1 unit of packed red blood cells.  Component of iron-     deficiency anemia, microcytic normochromic.  Serum iron very low at 16,     percent saturation low at 4.  Will be discharged on iron supplements.  3. History of achalasia with prior Heller myotomy.   PLAN:  1. The patient counseled ___________________ diagnostic cardiac     catheterization to be done at [facility] as an outpatient     December 01, 2002.  This is pending stable hemoglobin which will be  drawn     November 30, 2002.  Risks, benefits, potential complications and     alternatives to procedure were discussed in detail.  The patient     indicates his questions and concerns have  been addressed and is agreeable to proceed.  1. We will discharge on Plavix 75 mg one p.o. q.d. with p.r.n. sublingual     nitrates.   HISTORY OF PRESENT ILLNESS:  The patient is a very pleasant 65 year old  gentleman without prior cardiac history who has been experiencing greater-  than-a-year history of progressive fatigue/lack of energy.  Prior workup  included TSH which was within normal range.  Over the last 2 weeks he has  had complaint of exertional chest pain relating to his left shoulder (noted  on his walks).  Two days ago during breakfast he developed a slight nausea and then  subsequent left anterior chest pressure again radiating to the left  shoulder.  He denied nausea, diaphoresis or shortness of breath.  Discomfort  lasted approximately 30 minutes.  He took a brief nap.  When he awoke he was  discomfort-free but had a residual weird feeling.  Presented to [facility]  Emergency Room where his EKG was nonischemic.  There was no CHF on chest x-  ray.  He did rule out for a myocardial infarction during course of admission  by negative serial cardiac enzymes.  He underwent a stress Cardiolite on  November 25, 2002 which revealed question reversible defect anterior apical  region with a good ejection fraction and no regional wall motion  abnormalities.  After discussion of these results with the patient it was  felt best to proceed for cardiac catheterization to be scheduled as an  outpatient December 02, 2002.  Anticipate discharge on Plavix and p.r.n.  sublingual nitrates.  Etiology for his pain could just as likely be GI in  etiology as he does have a history of achalasia with prior Heller myotomy.  This admission his hemoglobin was 8.1 for which he was transfused with 1  unit of PRBCs.   Prior GI workup by Dr. Tasia Catchings included an EGD which  revealed reflux esophagitis with erosive distal esophagus; enlarged  esophagus from achalasia.  Status post Heller myotomy.  Colonoscopy was  normal.  Hemoglobin post transfusion is 9.1.   PREVIOUS MEDICAL HISTORY:  1. Achalasia; status post Heller myotomy.  He has had prior esophageal     surgery x2 in 1977 and again in 1985 in various locations     [place].  2. History of anemia with guaiac-positive stool.     a. (May 21, 2002) By Dr. Tasia Catchings, colonoscopy which was normal to        level of cecum.  EGD revealed reflux esophagitis with erosive distal        esophagitis, enlarged esophagus from achalasia.  Biopsy negative.        Gastric fundic polyps.  Negative H. pylori.  3. Progressive fatigue thought related to anemia.  4. Carpal tunnel syndrome.   PREVIOUS SURGICAL HISTORY:  As above.   SOCIAL AND HABITS:  Tobacco/ETOH: Negative.  He is single without children.  He works as a Financial risk analyst at [employer].   FAMILY HISTORY:  Dad died age 60 was diabetic and had CAD.  Mother died age  67 complications post hip surgery.  He has no siblings.   REVIEW OF SYSTEMS:  As in HPI/previous medical history otherwise benign.   PHYSICAL EXAMINATION:  VITAL SIGNS:  Blood pressure 106/70, heart rate 60  and regular, respiratory rate 20, he is afebrile.  GENERAL:  He is a slender well-nourished 65 year old gentleman in no acute  distress.  HEAD/EYES/EARS/NOSE AND THROAT:  Brisk bilateral carotid upstroke without  bruit.  Neck is without JVD.  No thyromegaly.  CARDIAC:  Regular rate and rhythm without murmur, rub, nor gallop.  Normal  S1 and S2.  CHEST:  Lung sounds clear with equal bilateral excursion.  Negative CPA  tenderness.  ABDOMINAL:  Soft, nondistended, normoactive bowel sounds.  Negative  abdominal aortic, renal or femoral bruit. EXTREMITIES:  Distal pulses intact.  Negative pedal edema.   NEUROLOGIC:  Grossly nonfocal.  Alert and oriented x3.  GENITORECTAL:  Exam deferred.   LABORATORY TESTS AND DATA:  Admission ER  hemoglobin 8.1; transfusion of 1  unit of PRBCs.  Hemoglobin 9.1 at time of discharge.  Sodium 138, potassium  3.8, chloride of 104, BUN of 16, creatinine 0.7, glucose of 109.  First CK  72, MB fraction 2.3, troponin I 0.01.  Second CK 61, MB fraction 2.0,  troponin I 0.01.  Third CK 75, MB fraction 1.6, troponin I 0.01.  Serum iron  decreased at 16, TIBC elevated at 449, B12 okay at 618, serum ferritin  decreased at 3 (reference range 22-322).  EKG reveals sinus bradycardia at  58 beats per minute nonischemic.  Chest x-ray revealed mild cardiac  enlargement, atelectasis and/or scar left lung base.     Salomon Fick, N.P.                       Francisca December, M.D.    MES/MEDQ  D:  11/25/2002  T:  11/25/2002  Job:  [id number]   cc:   Thelma Barge P. Modesto Charon, M.D.  [address]  [place]  [place] [address]  Fax: [phone]   Sherin Quarry, MD  [address]  [place]  [place] [address]  Fax: 1

## 2011-04-13 NOTE — Discharge Summary (Signed)
NAME:  Johnathan [name], Johnathan [name]                      ACCOUNT NO.:  [id number]   MEDICAL RECORD NO.:  [id number]                   PATIENT TYPE:  INP   LOCATION:  5733                                 FACILITY:  [facility]   PHYSICIAN:  Corinna L. Lendell Caprice, MD             DATE OF BIRTH:  02-01-1946   DATE OF ADMISSION:  12/09/2003  DATE OF DISCHARGE:  12/10/2003                                 DISCHARGE SUMMARY   DIAGNOSES:  1. Symptomatic anemia and heme positive stool.  2. Chronic gastrointestinal blood loss from erosive esophagitis.  3. History of severe gastroesophageal reflux disease and erosive     esophagitis.  4. Acolasia.  5. Iron-deficiency anemia.   DISCHARGE MEDICATIONS:  1. Nexium 40 mg p.o. daily.  2. Iron supplementation.  3. Fiber supplementation.  4. Lactulose 30 cc p.o. t.i.d. as needed for constipation.   CONDITION ON DISCHARGE:  Stable.   FOLLOWUP:  With M.D. in two to four weeks for a hemoglobin check and  medication check.   DIET:  As tolerated.   ACTIVITY:  Ad lib.   LABORATORY DATA:  Hemoglobin on admission was 9.3, hematocrit 27, MCV 83.  His coagulation panel was normal.  BUN was 23, creatinine 1, the rest of his  complete metabolic panel was within normal limits.  Ferritin was 1.  With  hydration, his hemoglobin dropped to 8.8, and he was transfused 1 unit of  packed red blood cells.  His hemoglobin after the transfusion was 9.3.   HISTORY AND HOSPITAL COURSE:  Johnathan [name] is a pleasant 65 year old white  male who has had a troublesome history of gastroesophageal reflux disease,  erosive esophagitis, and acolasia which has caused bleeding in the past.  He  had an EGD with dilatation by Dr. Sherin Quarry in June 2004.  He presented to  Dr. Nash Dimmer office complaining of feeling weak and tired and lightheaded.  He  was found to have a hemoglobin of 9.5, it had been 12 in December 2004.  He  had heme positive dark stool on examination, and had reported having a  lot  of black stools despite being off iron.  He admitted that he had stopped the  iron and the Prilosec OTC due to concerns over constipation.  He had not had  melanotic stool, but mainly constipation.  He was put on observation.  I  started Nexium as he was intolerant to Protonix in the past.  I wished to  try a new proton pump inhibitor.  He had no bowel movements whatsoever.  His  hemoglobin and hematocrit drifted down slightly, which I feel is secondary  to IV fluids.  As he was fairly symptomatic with this hemoglobin, I did  elect to give him 1 unit of packed red blood cells which improved his  symptoms somewhat.  The patient had lots of concerns over resuming his  medications and most of the problems were related to the constipation.  I  therefore started him on a stool softener and had given him a prescription  for Lactulose to use as needed as this has been his main complaint.  He has  iron tablets at home, and I  have encouraged him to take these and over-the-counter fiber supplement.  He  also is taking an herbal remedy, and I have asked that he bring that in to  Dr. Modesto Charon to see what exactly he is taking.  At this point, I feel that his  GI bleed is not life-threatening, but rather represents chronic GI blood  loss and iron-deficiency.                                                Corinna L. Lendell Caprice, MD    CLS/MEDQ  D:  12/10/2003  T:  12/11/2003  Job:  [id number]   cc:   Maryla Morrow. Modesto Charon, M.D.  [address]  [place]  [place] [address]  Fax: [phone]   Tasia Catchings, M.D.  [address]  [place]  [place] [address]  Fax: [phone]

## 2011-04-13 NOTE — Discharge Summary (Signed)
NAME:  Johnathan [name], Johnathan [name]                      ACCOUNT NO.:  [id number]   MEDICAL RECORD NO.:  [id number]                   PATIENT TYPE:  INP   LOCATION:  3706                                 FACILITY:  [facility]   PHYSICIAN:  Sherin Quarry, MD                   DATE OF BIRTH:  Jul 02, 1946   DATE OF ADMISSION:  11/23/2002  DATE OF DISCHARGE:  11/25/2002                                 DISCHARGE SUMMARY   HISTORY OF PRESENT ILLNESS:  The patient is a 65 year old gentleman who  initially presented to [facility] on November 23, 2002,  with a two-week history of progressively worsening chest pain.  These  episodes of pain are described as lasting for 30 minutes associated with  nausea and characteristically radiating to the left shoulder and arm with  associated dyspnea.  In addition, the patient states that he had noticed a  progressive deterioration in his ability to tolerate physical exercise.  He  was becoming short of breath with fairly limited activity.   PHYSICAL EXAMINATION AT THE TIME OF ADMISSION:  VITAL SIGNS:  His blood  pressure was 130/68, pulse 60, respiratory rate 16, temperature 97.2, O2  saturation was 99%.  HEENT:  Within normal limits.  CHEST:  Clear.  CARDIOVASCULAR:  Normal S1 and S2 without murmurs, rubs, or gallops.  ABDOMEN:  Benign with normal bowel sounds without masses, tenderness or  organomegaly.  EXTREMITIES:  Normal.  NEUROLOGIC:  Normal.   LABORATORY DATA:  CBC showed a hemoglobin of 8.1, hematocrit 26.3, white  count 6500.  Indices were profoundly microcytic.  The MCV was 69, MCHC 30,  platelet count 487,000.  BMET was within normal limits.  Serial cardiac  enzymes were negative.  Serum iron, total iron binding capacity and ferritin  levels were consistent with severe iron deficiency.  On further questioning,  the patient indicated that he had been evaluated by Dr. Sherin Quarry in June of  this year and had undergone upper endoscopy  and colonoscopy.  He stated that  on the basis of these studies, he was advised that he had evidence of severe  erosive esophagitis and that he needed to take Protonix.  Unfortunately, he  started to take the Protonix and felt that his symptoms were made worse by  this medication.  He, therefore, stopped taking it.  Subsequently, the  patient underwent a Cardiolite stress test on November 25, 2002.  This study  was performed under the guidance of Dr. Amil Amen.  I do not have the final  report on this study as yet; however, I am told that findings were  suspicious for a possible ischemic event.  For this reason, the plan is  going to be for the patient to return for a cardiac catheterization  to be  done by Dr. Amil Amen.  On November 25, 2002, the patient was discharged.   DISCHARGE MEDICATIONS:  1. Nexium  40 mg b.i.d. x2 weeks and then daily.  2. Niferex 150 mg b.i.d.  3. Plavix 75 mg daily.  4. Nitroglycerin 0.4 mg p.r.n. for chest pain.   FOLLOW UP:  The patient was instructed to return on Wednesday, December 02, 2002, for the purpose of proceeding with the cardiac catheterization under  Dr. Amil Amen' guidance.  The patient was also instructed to follow up with  Dr. Modesto Charon in a period of approximately 10 days.   CONDITION ON DISCHARGE:  Good.    DISCHARGE DIAGNOSES:  1. Hypochromic microcytic anemia associated with gastrointestinal blood loss     secondary to erosive esophagitis.  2. Chest pain with associated dyspnea possibly of angina origin.  3. Iron-deficiency anemia.                                               Sherin Quarry, MD    SY/MEDQ  D:  11/25/2002  T:  11/26/2002  Job:  [id number]   cc:   Maryla Morrow. Modesto Charon, M.D.  [address]  [place]  [place] [address]  Fax: [phone]   Francisca December, M.D.  [address]  [place]  [place] [address]  Fax: [phone]

## 2011-08-16 LAB — DIFFERENTIAL
Basophils Relative: 1
Eosinophils Relative: 2
Lymphocytes Relative: 15
Neutro Abs: 4.8
Neutrophils Relative %: 70

## 2011-08-16 LAB — HEMOGLOBIN AND HEMATOCRIT, BLOOD
HCT: 29.4 — ABNORMAL LOW
Hemoglobin: 9.3 — ABNORMAL LOW

## 2011-08-16 LAB — BASIC METABOLIC PANEL
CO2: 30
Calcium: 9.3
GFR calc Af Amer: >60
Glucose, Bld: 84
Potassium: 4.3
Sodium: 140

## 2011-08-16 LAB — CBC
HCT: 30.4 — ABNORMAL LOW
Hemoglobin: 9.7 — ABNORMAL LOW
MCHC: 32
MCV: 70.4 — ABNORMAL LOW
Platelets: 472 — ABNORMAL HIGH
RBC: 4.32
RDW: 23.9 — ABNORMAL HIGH
WBC: 6.8

## 2011-08-31 LAB — CBC
Platelets: 398
RBC: 3.37 — ABNORMAL LOW
WBC: 7.1

## 2011-08-31 LAB — I-STAT 8, (EC8 V) (CONVERTED LAB)
BUN: 24 — ABNORMAL HIGH
Chloride: 107
Glucose, Bld: 114 — ABNORMAL HIGH
HCT: 24 — ABNORMAL LOW
Hemoglobin: 8.2 — ABNORMAL LOW
Operator id: 294521
Potassium: 3.9
Sodium: 138

## 2011-08-31 LAB — HEMOGLOBIN AND HEMATOCRIT, BLOOD: Hemoglobin: 9.6 — ABNORMAL LOW

## 2011-08-31 LAB — POCT I-STAT CREATININE
Creatinine, Ser: 1
Operator id: 294521

## 2011-08-31 LAB — DIFFERENTIAL
Lymphocytes Relative: 15
Lymphs Abs: 1.1
Monocytes Relative: 13 — ABNORMAL HIGH
Neutrophils Relative %: 69

## 2011-08-31 LAB — TYPE AND SCREEN: Antibody Screen: NEGATIVE

## 2011-08-31 LAB — FOLATE: Folate: 9.8

## 2011-08-31 LAB — BASIC METABOLIC PANEL
BUN: 18
Calcium: 8.9
Chloride: 106
Chloride: 99
Creatinine, Ser: 1.06
GFR calc Af Amer: >60
GFR calc non Af Amer: >60
Glucose, Bld: 96
Potassium: 3.9
Sodium: 135

## 2011-08-31 LAB — HEMOGLOBIN: Hemoglobin: 9.2 — ABNORMAL LOW

## 2011-09-05 LAB — CROSSMATCH: Antibody Screen: NEGATIVE

## 2011-09-10 LAB — CROSSMATCH: ABO/RH(D): A POS

## 2012-02-18 ENCOUNTER — Other Ambulatory Visit: Payer: Self-pay | Admitting: Gastroenterology

## 2013-08-11 ENCOUNTER — Other Ambulatory Visit: Payer: Self-pay | Admitting: Family Medicine

## 2013-08-11 DIAGNOSIS — N5082 Scrotal pain: Secondary | ICD-10-CM

## 2013-08-13 ENCOUNTER — Ambulatory Visit
Admission: RE | Admit: 2013-08-13 | Discharge: 2013-08-13 | Disposition: A | Discharge status: Home / Self Care | Payer: BC Managed Care – PPO | Source: Ambulatory Visit | Attending: Family Medicine | Admitting: Family Medicine

## 2013-08-13 DIAGNOSIS — N5082 Scrotal pain: Secondary | ICD-10-CM

## 2015-05-02 ENCOUNTER — Ambulatory Visit (INDEPENDENT_AMBULATORY_CARE_PROVIDER_SITE_OTHER): Payer: Managed Care, Other (non HMO) | Admitting: Cardiology

## 2015-05-02 ENCOUNTER — Encounter: Payer: Self-pay | Admitting: Cardiology

## 2015-05-02 VITALS — BP 116/66 | HR 60 | Ht 68.0 in | Wt 143.1 lb

## 2015-05-02 DIAGNOSIS — R011 Cardiac murmur, unspecified: Secondary | ICD-10-CM | POA: Diagnosis not present

## 2015-05-02 DIAGNOSIS — R5382 Chronic fatigue, unspecified: Secondary | ICD-10-CM | POA: Diagnosis not present

## 2015-05-02 DIAGNOSIS — I4940 Unspecified premature depolarization: Secondary | ICD-10-CM | POA: Diagnosis not present

## 2015-05-02 DIAGNOSIS — I499 Cardiac arrhythmia, unspecified: Secondary | ICD-10-CM | POA: Diagnosis not present

## 2015-05-02 DIAGNOSIS — R5383 Other fatigue: Secondary | ICD-10-CM | POA: Insufficient documentation

## 2015-05-02 LAB — TSH: TSH: 1.409 u[IU]/mL (ref 0.350–4.500)

## 2015-05-02 NOTE — Progress Notes (Signed)
Cardiology Office Note   Date:  05/02/2015   ID:  Johnathan [name], DOB [birth year], MRN [id number]  PCP:  [name], MD  Cardiologist:   Minus Breeding, MD   Chief Complaint  Patient presents with  . Bradycardia      History of Present Illness: Johnathan [name] is a 69 y.o. male who presents for evaluation of bradycardia. He has a history of chronic anemia from a history of GI surgeries starting for treatment of achalasia years ago. He gets fatigued when he gets anemic but it's usually correctable with iron. However, more recently he has not felt better after taking iron. He doesn't believe that he is profoundly anemic. He saw his primary provider and was noted to have a slow heart rhythm. EKG demonstrates nonconducted PACs. He does not have presyncope or syncope. He occasionally feels skipped beats but no sustained dysrhythmias. He has not get chest pressure, neck or arm discomfort. 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 He does not get shortness of breath, PND or orthopnea. He might get some very mild ankle edema always particularly anemic but is otherwise had no weight gain.  Of note the patient does recall having a cardiac catheterization about 10 years ago but I don't have any of these results and he's not clear on the indication.   Past Medical History  Diagnosis Date  . Bradycardia   . Anemia   . Hiatal hernia   . GERD (gastroesophageal reflux disease)   . Herniated disc, cervical   . H/O: GI bleed   . Metacarpal bone fracture   . Rosacea, acne   . Hyperlipidemia     Past Surgical History  Procedure Laterality Date  . Hernia repair    . Nissen fundoplication  1856  . Cervical fusion    . Varicocele excision Left   . Myotomy       Current Outpatient Prescriptions  Medication Sig Dispense Refill  . Cholecalciferol (VITAMIN D3) 5000 UNITS CAPS Take 1 capsule by mouth daily.    . Iron-FA-B  Cmp-C-Biot-Probiotic (FUSION PLUS) CAPS As Needed  1  . Magnesium 400 MG CAPS Take 1 capsule by mouth daily.    Marland Kitchen omeprazole (PRILOSEC) 40 MG capsule Take 40 mg by mouth daily.     No current facility-administered medications for this visit.    Allergies:   Review of patient's allergies indicates no known allergies.    Social History:  The patient  reports that he has never smoked. He does not have any smokeless tobacco history on file.   Family History:  The patient's family history includes Breast cancer in his mother; Diabetes in his father and paternal grandmother; Heart disease in his father.    ROS:  Please see the history of present illness.   Otherwise, review of systems are positive for reflux.   All other systems are reviewed and negative.    PHYSICAL EXAM: VS:  BP 116/66 mmHg  Pulse 60  Ht 5\' 8"  (1.727 m)  Wt 143 lb 1.6 oz (64.91 kg)  BMI 21.76 kg/m2 , BMI Body mass index is 21.76 kg/(m^2). GENERAL:  Well appearing HEENT:  Pupils equal round and reactive, fundi not visualized, oral mucosa unremarkable NECK:  No jugular venous distention, waveform within normal limits, carotid upstroke brisk and symmetric, no bruits, no thyromegaly LYMPHATICS:  No cervical, inguinal adenopathy LUNGS:  Clear to auscultation bilaterally BACK:  No CVA tenderness CHEST:  Unremarkable HEART:  PMI not displaced or sustained,S1 and S2 within normal limits, no S3, no S4, no clicks, no rubs, 2 out of 6 apical systolic murmur heard early in systole and radiating slightly to the apex murmurs ABD:  Flat, positive bowel sounds normal in frequency in pitch, no bruits, no rebound, no guarding, no midline pulsatile mass, no hepatomegaly, no splenomegaly EXT:  2 plus pulses throughout, no edema, no cyanosis no clubbing SKIN:  No rashes no nodules NEURO:  Cranial nerves II through XII grossly intact, motor grossly intact throughout PSYCH:  Cognitively intact, oriented to person place and time    EKG:   EKG is ordered today. The ekg ordered today demonstrates sinus rhythm, rate 60, possible left atrial enlargement, premature atrial contractions nonconducted.   Recent Labs: See below.  Labs from primary provider reviewed.    Lipid Panel    Wt Readings from Last 3 Encounters:  05/02/15 143 lb 1.6 oz (64.91 kg)      Other studies Reviewed: Additional studies/ records that were reviewed today include: Outside office records. Review of the above records demonstrates:  Please see elsewhere in the note.     ASSESSMENT AND PLAN:  ARRHYTHMIA:   The patient does have blocked PACs. I will apply a 24-hour Holter to see if there are any other bradycardia arrhythmias. At this point I doubt that this should be causing his fatigue which is his primary complaint.  I was able to review recent labs and these don't include a TSH.  I will order this.   MURMUR:  I will order an echocardiogram to evaluate this is along with his mildly abnormal EKG.   Current medicines are reviewed at length with the patient today.  The patient does not have concerns regarding medicines.  The following changes have been made:  no change  Labs/ tests ordered today include:   Orders Placed This Encounter  Procedures  . TSH  . Holter monitor - 24 hour  . EKG 12-Lead  . ECHOCARDIOGRAM COMPLETE     Disposition:   FU with me in one month.    Signed, Minus Breeding, MD  05/02/2015 10:13 AM    [place]

## 2015-05-02 NOTE — Patient Instructions (Addendum)
Your physician recommends that you schedule a follow-up appointment in: [place] has requested that you have an echocardiogram. Echocardiography is a painless test that uses sound waves to create images of your heart. It provides your doctor with information about the size and shape of your heart and how well your heart's chambers and valves are working. This procedure takes approximately one hour. There are no restrictions for this procedure.  Your physician has recommended that you wear a holter monitor. Holter monitors are medical devices that record the heart's electrical activity. Doctors most often use these monitors to diagnose arrhythmias. Arrhythmias are problems with the speed or rhythm of the heartbeat. The monitor is a small, portable device. You can wear one while you do your normal daily activities. This is usually used to diagnose what is causing palpitations/syncope (passing out).  Your physician recommends that you return for lab work TSH

## 2015-05-03 ENCOUNTER — Ambulatory Visit (HOSPITAL_COMMUNITY)
Admission: RE | Admit: 2015-05-03 | Discharge: 2015-05-03 | Disposition: A | Discharge status: Home / Self Care | Payer: Managed Care, Other (non HMO) | Source: Ambulatory Visit | Attending: Cardiovascular Disease | Admitting: Cardiovascular Disease

## 2015-05-03 ENCOUNTER — Encounter: Payer: Self-pay | Admitting: Cardiology

## 2015-05-03 DIAGNOSIS — I071 Rheumatic tricuspid insufficiency: Secondary | ICD-10-CM | POA: Insufficient documentation

## 2015-05-03 DIAGNOSIS — R001 Bradycardia, unspecified: Secondary | ICD-10-CM | POA: Diagnosis not present

## 2015-05-03 DIAGNOSIS — I4940 Unspecified premature depolarization: Secondary | ICD-10-CM

## 2015-05-03 DIAGNOSIS — I340 Nonrheumatic mitral (valve) insufficiency: Secondary | ICD-10-CM | POA: Insufficient documentation

## 2015-05-03 DIAGNOSIS — R011 Cardiac murmur, unspecified: Secondary | ICD-10-CM | POA: Diagnosis not present

## 2015-05-04 ENCOUNTER — Telehealth: Payer: Self-pay | Admitting: Cardiology

## 2015-05-04 ENCOUNTER — Ambulatory Visit (INDEPENDENT_AMBULATORY_CARE_PROVIDER_SITE_OTHER): Payer: Managed Care, Other (non HMO)

## 2015-05-04 DIAGNOSIS — I4940 Unspecified premature depolarization: Secondary | ICD-10-CM | POA: Diagnosis not present

## 2015-05-04 DIAGNOSIS — R011 Cardiac murmur, unspecified: Secondary | ICD-10-CM

## 2015-05-04 NOTE — Telephone Encounter (Signed)
Holter monitor report downloaded. Patient wore monitor 6/6 and returned on 6/7  Will route message to Dr. Ledell Peoples, CMA will upload monitor report to MD basket to review and advise  Patient may need appointment sooner than July 7th.

## 2015-05-04 NOTE — Telephone Encounter (Signed)
Patient called in with multiple questions.   1. Lab work - is TSH normal - notified YES 2. Patient has h/o GI condition/irritation that causes bleeding >> anemia - he took PPI for 10 years - is this linked to his heart condition? (bradycardia) 3. Any hypothesis of what caused his slow heart rate? 4. After notifying patient of echo results, he asked if his heart condition (bradycardia) is linked to his lack of energy, extreme fatigue, feeling horrible, dizzy spells, swelling ankles & feet   >> he states he assumed these symptoms were due to his anemia but he is on iron pills now and is still having symptoms  >> informed patient the swelling is not likely linked to his bradycardia  Informed patient I would defer this message to Dr. Salome Spotted to review and advise further

## 2015-05-04 NOTE — Telephone Encounter (Signed)
Abnormal monitor report received.   2:32 am - low HR 27bpm for 12 seconds 5:01am - 3.1 second pause  Report will be faxed.

## 2015-05-04 NOTE — Telephone Encounter (Signed)
Please call,pt have some questions he needs to ask  about his echo he had yesterday.

## 2015-05-05 NOTE — Telephone Encounter (Signed)
I have reviewed the Holter and will include the answer to these questions with that result.

## 2015-05-05 NOTE — Telephone Encounter (Signed)
Results of the Holter will be called to the patient.  I have reviewed this.

## 2015-05-12 ENCOUNTER — Telehealth: Payer: Self-pay | Admitting: Cardiology

## 2015-05-12 NOTE — Telephone Encounter (Signed)
Elmyra Ricks was calling in the let Dr. Percival Spanish know that Dr. Hosie Poisson would be working until 12 today if he had any questions after reviewing the pt's EKG.   Thanks

## 2015-05-13 ENCOUNTER — Encounter: Payer: Self-pay | Admitting: Cardiology

## 2015-05-13 NOTE — Telephone Encounter (Signed)
Spoke with dr Jonny Ruiz, she talked with dr hochrein regarding this patient yesterday. She is aware the patient will be seeing [place] next week in follow up.

## 2015-05-16 ENCOUNTER — Encounter: Payer: Self-pay | Admitting: Cardiology

## 2015-05-19 ENCOUNTER — Ambulatory Visit (INDEPENDENT_AMBULATORY_CARE_PROVIDER_SITE_OTHER): Payer: Managed Care, Other (non HMO) | Admitting: Cardiology

## 2015-05-19 ENCOUNTER — Encounter: Payer: Self-pay | Admitting: Cardiology

## 2015-05-19 VITALS — BP 112/70 | HR 98 | Ht 68.0 in | Wt 143.7 lb

## 2015-05-19 DIAGNOSIS — I499 Cardiac arrhythmia, unspecified: Secondary | ICD-10-CM | POA: Diagnosis not present

## 2015-05-19 DIAGNOSIS — R5383 Other fatigue: Secondary | ICD-10-CM

## 2015-05-19 NOTE — Progress Notes (Signed)
Cardiology Office Note   Date:  05/19/2015   ID:  Johnathan [name], DOB 15-Sep-1946, MRN [id number]  PCP:  [name], MD  Cardiologist:   Minus Breeding, MD   Chief Complaint  Patient presents with  . Fatigue      History of Present Illness: Johnathan [name] is a 69 y.o. male who presents for evaluation of bradycardia. His biggest complaint has been fatigue. I saw him for this and he did have a monitor which demonstrated some sinus bradycardia, this was particularly at night. He did have PACs and this is noted on EKG. Some of these are blocked. There were no however sustained or necessarily symptomatic dysrhythmias. He appeared to have chronotropic competence. Echocardiogram demonstrated some mild mitral regurgitation. He does have a chronic anemia which is mild. He tells me recently his ferritin level was very low. He had only been taking this medicine supplement periodically but he started taking iron daily now. He still very tired. 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 He's not having any presyncope or syncope. He denies any chest pressure, neck or arm discomfort. He's not feeling the palpitations. He has no new shortness of breath. He's had some trace chronic edema.  Past Medical History  Diagnosis Date  . Bradycardia   . Anemia   . Hiatal hernia   . GERD (gastroesophageal reflux disease)   . Herniated disc, cervical   . H/O: GI bleed   . Metacarpal bone fracture   . Rosacea, acne   . Hyperlipidemia     Past Surgical History  Procedure Laterality Date  . Hernia repair    . Nissen fundoplication  1224  . Cervical fusion    . Varicocele excision Left   . Myotomy       Current Outpatient Prescriptions  Medication Sig Dispense Refill  . Cholecalciferol (VITAMIN D3) 5000 UNITS CAPS Take 1 capsule by mouth daily.    . Iron-FA-B Cmp-C-Biot-Probiotic (FUSION PLUS) CAPS As Needed  1  . omeprazole (PRILOSEC) 40 MG capsule Take 40 mg  by mouth daily.     No current facility-administered medications for this visit.    Allergies:   Review of patient's allergies indicates no known allergies.    Social History:  The patient  reports that he has never smoked. He does not have any smokeless tobacco history on file.   Family History:  The patient's family history includes Breast cancer in his mother; Diabetes in his father and paternal grandmother; Heart disease in his father.    ROS:  Please see the history of present illness.   Otherwise, review of systems are positive for reflux.   All other systems are reviewed and negative.    PHYSICAL EXAM: VS:  BP 112/70 mmHg  Pulse 98  Ht 5\' 8"  (1.727 m)  Wt 143 lb 11.2 oz (65.182 kg)  BMI 21.85 kg/m2 , BMI Body mass index is 21.85 kg/(m^2). GENERAL:  Well appearing HEENT:  Pupils equal round and reactive, fundi not visualized, oral mucosa unremarkable NECK:  No jugular venous distention, waveform within normal limits, carotid upstroke brisk and symmetric, no bruits, no thyromegaly LUNGS:  Clear to auscultation bilaterally CHEST:  Unremarkable HEART:  PMI not displaced or sustained,S1 and S2 within normal limits, no S3, no S4, no clicks, no rubs, 2 out of 6 apical systolic murmur heard early in systole and radiating slightly to the apex murmurs ABD:  Flat, positive bowel sounds  normal in frequency in pitch, no bruits, no rebound, no guarding, no midline pulsatile mass, no hepatomegaly, no splenomegaly EXT:  2 plus pulses throughout, no edema, no cyanosis no clubbing    Recent Labs: See below.  Labs from primary provider reviewed.    Lipid Panel    Wt Readings from Last 3 Encounters:  05/19/15 143 lb 11.2 oz (65.182 kg)  05/02/15 143 lb 1.6 oz (64.91 kg)      Other studies Reviewed: Additional studies/ records that were reviewed today include: Holter and labs Review of the above records demonstrates:  Please see elsewhere in the note.     ASSESSMENT AND  PLAN:  ARRHYTHMIA:   I don't strongly suspect this is causing his tiredness. However, I'm going to bring him back and do a POET (Plain Old Exercise Treadmill) to make sure he doesn't have any exercise-induced dysrhythmia. At this point he does not have an indication for pacing.  MURMUR:  He had mild mitral valve regurgitation on echo and we will follow this clinically.  FATIGUE:  Along with stress testing above I will order a testosterone level. If none of this is revealing I might order a sleep study.   Current medicines are reviewed at length with the patient today.  The patient does not have concerns regarding medicines.  The following changes have been made:  no change  Labs/ tests ordered today include:   Orders Placed This Encounter  Procedures  . Testosterone  . Exercise Tolerance Test     Disposition:   FU with me after this testing.    Signed, Minus Breeding, MD  05/19/2015 3:39 PM    [facility]

## 2015-05-19 NOTE — Patient Instructions (Addendum)
Your physician recommends that you schedule a follow-up appointment After Test  Your physician recommends that you return for lab work in: Testosterone level  Your physician has requested that you have an exercise tolerance test. For further information please visit [email or web address]. Please also follow instruction sheet, as given.

## 2015-05-20 LAB — TESTOSTERONE: Testosterone: 315 ng/dL (ref 300–890)

## 2015-06-02 ENCOUNTER — Ambulatory Visit: Payer: Managed Care, Other (non HMO) | Admitting: Cardiology

## 2015-06-13 ENCOUNTER — Ambulatory Visit (HOSPITAL_COMMUNITY)
Admission: AD | Admit: 2015-06-13 | Discharge: 2015-06-13 | Disposition: A | Discharge status: Home / Self Care | Payer: Managed Care, Other (non HMO) | Source: Ambulatory Visit | Attending: Gastroenterology | Admitting: Gastroenterology

## 2015-06-13 ENCOUNTER — Ambulatory Visit (HOSPITAL_COMMUNITY): Payer: Managed Care, Other (non HMO) | Admitting: Anesthesiology

## 2015-06-13 ENCOUNTER — Encounter: Payer: Self-pay | Admitting: Cardiology

## 2015-06-13 ENCOUNTER — Encounter (HOSPITAL_COMMUNITY): Payer: Self-pay | Admitting: Certified Registered Nurse Anesthetist

## 2015-06-13 ENCOUNTER — Encounter (HOSPITAL_COMMUNITY)
Admission: AD | Disposition: A | Discharge status: Home / Self Care | Payer: Self-pay | Source: Ambulatory Visit | Attending: Gastroenterology

## 2015-06-13 DIAGNOSIS — D122 Benign neoplasm of ascending colon: Secondary | ICD-10-CM | POA: Diagnosis not present

## 2015-06-13 DIAGNOSIS — K317 Polyp of stomach and duodenum: Secondary | ICD-10-CM | POA: Insufficient documentation

## 2015-06-13 DIAGNOSIS — D509 Iron deficiency anemia, unspecified: Secondary | ICD-10-CM | POA: Insufficient documentation

## 2015-06-13 DIAGNOSIS — Z79899 Other long term (current) drug therapy: Secondary | ICD-10-CM | POA: Insufficient documentation

## 2015-06-13 DIAGNOSIS — K210 Gastro-esophageal reflux disease with esophagitis: Secondary | ICD-10-CM | POA: Diagnosis not present

## 2015-06-13 DIAGNOSIS — E785 Hyperlipidemia, unspecified: Secondary | ICD-10-CM | POA: Diagnosis not present

## 2015-06-13 DIAGNOSIS — R5383 Other fatigue: Secondary | ICD-10-CM | POA: Diagnosis not present

## 2015-06-13 DIAGNOSIS — I340 Nonrheumatic mitral (valve) insufficiency: Secondary | ICD-10-CM | POA: Insufficient documentation

## 2015-06-13 DIAGNOSIS — K449 Diaphragmatic hernia without obstruction or gangrene: Secondary | ICD-10-CM | POA: Insufficient documentation

## 2015-06-13 DIAGNOSIS — D124 Benign neoplasm of descending colon: Secondary | ICD-10-CM | POA: Diagnosis not present

## 2015-06-13 DIAGNOSIS — K6389 Other specified diseases of intestine: Secondary | ICD-10-CM | POA: Diagnosis not present

## 2015-06-13 DIAGNOSIS — Z86010 Personal history of colonic polyps: Secondary | ICD-10-CM | POA: Insufficient documentation

## 2015-06-13 DIAGNOSIS — K644 Residual hemorrhoidal skin tags: Secondary | ICD-10-CM | POA: Insufficient documentation

## 2015-06-13 HISTORY — PX: COLONOSCOPY WITH PROPOFOL: SHX5780

## 2015-06-13 HISTORY — PX: ESOPHAGOGASTRODUODENOSCOPY (EGD) WITH PROPOFOL: SHX5813

## 2015-06-13 LAB — CBC
HCT: 36.1 % — ABNORMAL LOW (ref 39.0–52.0)
Hemoglobin: 11.2 g/dL — ABNORMAL LOW (ref 13.0–17.0)
MCH: 26.6 pg (ref 26.0–34.0)
MCHC: 31 g/dL (ref 30.0–36.0)
MCV: 85.7 fL (ref 78.0–100.0)
PLATELETS: 230 10*3/uL (ref 150–400)
RBC: 4.21 MIL/uL — ABNORMAL LOW (ref 4.22–5.81)
RDW: 20.1 % — ABNORMAL HIGH (ref 11.5–15.5)
WBC: 3.9 10*3/uL — ABNORMAL LOW (ref 4.0–10.5)

## 2015-06-13 LAB — FERRITIN: FERRITIN: 7 ng/mL — AB (ref 24–336)

## 2015-06-13 SURGERY — ESOPHAGOGASTRODUODENOSCOPY (EGD) WITH PROPOFOL
Anesthesia: Monitor Anesthesia Care

## 2015-06-13 MED ORDER — PROPOFOL 10 MG/ML IV BOLUS
INTRAVENOUS | Status: DC | PRN
  Administered 2015-06-13 (×3): 40 mg via INTRAVENOUS
  Administered 2015-06-13 (×3): 20 mg via INTRAVENOUS
  Administered 2015-06-13 (×2): 40 mg via INTRAVENOUS
  Administered 2015-06-13: 10 mg via INTRAVENOUS
  Administered 2015-06-13 (×3): 20 mg via INTRAVENOUS
  Administered 2015-06-13: 10 mg via INTRAVENOUS
  Administered 2015-06-13: 20 mg via INTRAVENOUS
  Administered 2015-06-13: 40 mg via INTRAVENOUS

## 2015-06-13 MED ORDER — LACTATED RINGERS IV SOLN
INTRAVENOUS | Status: DC
  Administered 2015-06-13: 12:00:00 via INTRAVENOUS

## 2015-06-13 MED ORDER — BUTAMBEN-TETRACAINE-BENZOCAINE 2-2-14 % EX AERO
INHALATION_SPRAY | CUTANEOUS | Status: DC | PRN
  Administered 2015-06-13: 2 via TOPICAL

## 2015-06-13 MED ORDER — GLYCOPYRROLATE 0.2 MG/ML IJ SOLN
INTRAMUSCULAR | Status: AC
  Filled 2015-06-13: qty 3

## 2015-06-13 MED ORDER — SODIUM CHLORIDE 0.9 % IJ SOLN
INTRAMUSCULAR | Status: AC
  Filled 2015-06-13: qty 10

## 2015-06-13 MED ORDER — PROPOFOL 10 MG/ML IV BOLUS
INTRAVENOUS | Status: AC
  Filled 2015-06-13: qty 20

## 2015-06-13 MED ORDER — GLYCOPYRROLATE 0.2 MG/ML IJ SOLN
INTRAMUSCULAR | Status: DC | PRN
  Administered 2015-06-13: 0.2 mg via INTRAVENOUS

## 2015-06-13 SURGICAL SUPPLY — 24 items

## 2015-06-13 NOTE — Anesthesia Preprocedure Evaluation (Addendum)
Anesthesia Evaluation  Patient identified by MRN, date of birth, ID band Patient awake    Reviewed: Allergy & Precautions, NPO status , Patient's Chart, lab work & pertinent test results  History of Anesthesia Complications Negative for: history of anesthetic complications  Airway Mallampati: II  TM Distance: >3 FB Neck ROM: Full    Dental no notable dental hx. (+) Dental Advisory Given   Pulmonary neg pulmonary ROS,  breath sounds clear to auscultation  Pulmonary exam normal       Cardiovascular negative cardio ROS Normal cardiovascular exam+ dysrhythmias (bradycardia, unchanged from normal) + Valvular Problems/Murmurs (mild mitral regurgitation) MR Rhythm:Regular Rate:Normal  Echo 6/16:Compared to the prior echo in 2012, the EF is unchanged. There is diastolic dysfunction with normal LV filling pressure. Sinus bradycardia is still present however, pauses of around 2 seconds were noted during this study, mild MR, TR, mildly elevated RVSP of 36 mmHg.  Recently seen by his cardiologist with unchanged bradycardia with pauses, no need for pacing at this time per cardiology, will obtain a repeat 12 lead. Patient reports METS >4 and no symptoms   Neuro/Psych negative neurological ROS  negative psych ROS   GI/Hepatic Neg liver ROS, GERD-  Medicated and Controlled,  Endo/Other  negative endocrine ROS  Renal/GU negative Renal ROS  negative genitourinary   Musculoskeletal negative musculoskeletal ROS (+)   Abdominal   Peds negative pediatric ROS (+)  Hematology negative hematology ROS (+)   Anesthesia Other Findings   Reproductive/Obstetrics negative OB ROS                           Anesthesia Physical Anesthesia Plan  ASA: II  Anesthesia Plan: MAC   Post-op Pain Management:    Induction: Intravenous  Airway Management Planned:   Additional Equipment:   Intra-op Plan:    Post-operative Plan:   Informed Consent: I have reviewed the patients History and Physical, chart, labs and discussed the procedure including the risks, benefits and alternatives for the proposed anesthesia with the patient or authorized representative who has indicated his/her understanding and acceptance.   Dental advisory given  Plan Discussed with: CRNA  Anesthesia Plan Comments:         Anesthesia Quick Evaluation

## 2015-06-13 NOTE — Discharge Instructions (Signed)
Endoscopy Care After Please read the instructions outlined below and refer to this sheet in the next few weeks. These discharge instructions provide you with general information on caring for yourself after you leave the hospital. Your doctor may also give you specific instructions. While your treatment has been planned according to the most current medical practices available, unavoidable complications occasionally occur. If you have any problems or questions after discharge, please call Dr. Paulita Fujita [facility]) at [phone].  HOME CARE INSTRUCTIONS Activity  You may resume your regular activity but move at a slower pace for the next 24 hours.   Take frequent rest periods for the next 24 hours.   Walking will help expel (get rid of) the air and reduce the bloated feeling in your abdomen.   No driving for 24 hours (because of the anesthesia (medicine) used during the test).   You may shower.   Do not sign any important legal documents or operate any machinery for 24 hours (because of the anesthesia used during the test).  Nutrition  Drink plenty of fluids.   You may resume your normal diet.   Begin with a light meal and progress to your normal diet.   Avoid alcoholic beverages for 24 hours or as instructed by your caregiver.  Medications You may resume your normal medications unless your caregiver tells you otherwise. What you can expect today  You may experience abdominal discomfort such as a feeling of fullness or "gas" pains.   You may experience a sore throat for 2 to 3 days. This is normal. Gargling with salt water may help this.    SEEK IMMEDIATE MEDICAL CARE IF:  You have excessive nausea (feeling sick to your stomach) and/or vomiting.   You have severe abdominal pain and distention (swelling).   You have trouble swallowing.   You have a temperature over 100 F (37.8 C).   You have rectal bleeding or vomiting of blood.  Document Released:  06/26/2004 Document Revised: 07/25/2011 Document Reviewed: 01/07/2008 [facility] Patient Information 2012 [place].  Colonoscopy  Post procedure instructions:  Read the instructions outlined below and refer to this sheet in the next few weeks. These discharge instructions provide you with general information on caring for yourself after you leave the hospital. Your doctor may also give you specific instructions. While your treatment has been planned according to the most current medical practices available, unavoidable complications occasionally occur. If you have any problems or questions after discharge, call Dr. Paulita Fujita at [facility] [phone]).  HOME CARE INSTRUCTIONS  ACTIVITY:  You may resume your regular activity, but move at a slower pace for the next 24 hours.   Take frequent rest periods for the next 24 hours.   Walking will help get rid of the air and reduce the bloated feeling in your belly (abdomen).   No driving for 24 hours (because of the medicine (anesthesia) used during the test).   You may shower.   Do not sign any important legal documents or operate any machinery for 24 hours (because of the anesthesia used during the test).  NUTRITION:  Drink plenty of fluids.   You may resume your normal diet as instructed by your doctor.   Begin with a light meal and progress to your normal diet. Heavy or fried foods are harder to digest and may make you feel sick to your stomach (nauseated).   Avoid alcoholic beverages for 24 hours or as instructed.  MEDICATIONS:  You may resume your normal  medications unless your doctor tells you otherwise.  WHAT TO EXPECT TODAY:  Some feelings of bloating in the abdomen.   Passage of more gas than usual.   Spotting of blood in your stool or on the toilet paper.  IF YOU HAD POLYPS REMOVED DURING THE COLONOSCOPY:  No aspirin products for 7 days or as instructed.   No alcohol for 7 days or as instructed.   Eat a  soft diet for the next 24 hours.   FINDING OUT THE RESULTS OF YOUR TEST  Not all test results are available during your visit. If your test results are not back during the visit, make an appointment with your caregiver to find out the results. Do not assume everything is normal if you have not heard from your caregiver or the medical facility. It is important for you to follow up on all of your test results.     SEEK IMMEDIATE MEDICAL CARE IF:   You have more than a spotting of blood in your stool.   Your belly is swollen (abdominal distention).   You are nauseated or vomiting.   You have a fever.   You have abdominal pain or discomfort that is severe or gets worse throughout the day.    Document Released: 06/26/2004 Document Revised: 07/25/2011 Document Reviewed: 06/24/2008 [facility] Patient Information 2012 [place]. Colonoscopy, Care After These instructions give you information on caring for yourself after your procedure. Your doctor may also give you more specific instructions. Call your doctor if you have any problems or questions after your procedure. HOME CARE  Do not drive for 24 hours.  Do not sign important papers or use machinery for 24 hours.  You may shower.  You may go back to your usual activities, but go slower for the first 24 hours.  Take rest breaks often during the first 24 hours.  Walk around or use warm packs on your belly (abdomen) if you have belly cramping or gas.  Drink enough fluids to keep your pee (urine) clear or pale yellow.  Resume your normal diet. Avoid heavy or fried foods.  Avoid drinking alcohol for 24 hours or as told by your doctor.  Only take medicines as told by your doctor. If a tissue sample (biopsy) was taken during the procedure:   Do not take aspirin or blood thinners for 7 days, or as told by your doctor.  Do not drink alcohol for 7 days, or as told by your doctor.  Eat soft foods for the first 24 hours. GET  HELP IF: You still have a small amount of blood in your poop (stool) 2-3 days after the procedure. GET HELP RIGHT AWAY IF:  You have more than a small amount of blood in your poop.  You see clumps of tissue (blood clots) in your poop.  Your belly is puffy (swollen).  You feel sick to your stomach (nauseous) or throw up (vomit).  You have a fever.  You have belly pain that gets worse and medicine does not help. MAKE SURE YOU:  Understand these instructions.  Will watch your condition.  Will get help right away if you are not doing well or get worse. Document Released: 12/15/2010 Document Revised: 11/17/2013 Document Reviewed: 07/20/2013 [facility] Patient Information 2015 [place], [place]. This information is not intended to replace advice given to you by your health care provider. Make sure you discuss any questions you have with your health care provider. Esophagogastroduodenoscopy Esophagogastroduodenoscopy (EGD) is a procedure to examine  the lining of the esophagus, stomach, and first part of the small intestine (duodenum). A long, flexible, lighted tube with a camera attached (endoscope) is inserted down the throat to view these organs. This procedure is done to detect problems or abnormalities, such as inflammation, bleeding, ulcers, or growths, in order to treat them. The procedure lasts about 5-20 minutes. It is usually an outpatient procedure, but it may need to be performed in emergency cases in the hospital. LET YOUR CAREGIVER KNOW ABOUT:   Allergies to food or medicine.  All medicines you are taking, including vitamins, herbs, eyedrops, and over-the-counter medicines and creams.  Use of steroids (by mouth or creams).  Previous problems you or members of your family have had with the use of anesthetics.  Any blood disorders you have.  Previous surgeries you have had.  Other health problems you have.  Possibility of pregnancy, if this applies. RISKS AND COMPLICATIONS    Generally, EGD is a safe procedure. However, as with any procedure, complications can occur. Possible complications include:  Infection.  Bleeding.  Tearing (perforation) of the esophagus, stomach, or duodenum.  Difficulty breathing or not being able to breath.  Excessive sweating.  Spasms of the larynx.  Slowed heartbeat.  Low blood pressure. BEFORE THE PROCEDURE  Do not eat or drink anything for 6-8 hours before the procedure or as directed by your caregiver.  Ask your caregiver about changing or stopping your regular medicines.  If you wear dentures, be prepared to remove them before the procedure.  Arrange for someone to drive you home after the procedure. PROCEDURE   A vein will be accessed to give medicines and fluids. A medicine to relax you (sedative) and a pain reliever will be given through that access into the vein.  A numbing medicine (local anesthetic) may be sprayed on your throat for comfort and to stop you from gagging or coughing.  A mouth guard may be placed in your mouth to protect your teeth and to keep you from biting on the endoscope.  You will be asked to lie on your left side.  The endoscope is inserted down your throat and into the esophagus, stomach, and duodenum.  Air is put through the endoscope to allow your caregiver to view the lining of your esophagus clearly.  The esophagus, stomach, and duodenum is then examined. During the exam, your caregiver may:  Remove tissue to be examined under a microscope (biopsy) for inflammation, infection, or other medical problems.  Remove growths.  Remove objects (foreign bodies) that are stuck.  Treat any bleeding with medicines or other devices that stop tissues from bleeding (hot cautery, clipping devices).  Widen (dilate) or stretch narrowed areas of the esophagus and stomach.  The endoscope will then be withdrawn. AFTER THE PROCEDURE  You will be taken to a recovery area to be monitored. You  will be able to go home once you are stable and alert.  Do not eat or drink anything until the local anesthetic and numbing medicines have worn off. You may choke.  It is normal to feel bloated, have pain with swallowing, or have a sore throat for a short time. This will wear off.  Your caregiver should be able to discuss his or her findings with you. It will take longer to discuss the test results if any biopsies were taken. Document Released: 03/15/2005 Document Revised: 03/29/2014 Document Reviewed: 10/15/2012 [facility] Patient Information 2015 [place], [place]. This information is not intended to replace advice given to  you by your health care provider. Make sure you discuss any questions you have with your health care provider.

## 2015-06-13 NOTE — Op Note (Signed)
[facility] [place], [id number]   ENDOSCOPY PROCEDURE REPORT  PATIENT: Johnathan [name], Johnathan [name]  MR#: [id number] BIRTHDATE: September 10, 1946 , 80  yrs. old GENDER: male ENDOSCOPIST: Arta Silence, MD REFERRED BY:  Clarene Essex, M.D. PROCEDURE DATE:  12-Jul-2015 PROCEDURE:  EGD w/ biopsy ASA CLASS:     Class II INDICATIONS:  iron-deficiency anemia, esophageal ulceration. MEDICATIONS: Monitored anesthesia care TOPICAL ANESTHETIC: Cetacaine spray  DESCRIPTION OF PROCEDURE: After the risks benefits and alternatives of the procedure were thoroughly explained, informed consent was obtained.  The Pentax Gastroscope [id number] endoscope was introduced through the mouth and advanced to the second portion of the duodenum. The instrument was slowly withdrawn as the mucosa was fully examined. Estimated blood loss is zero unless otherwise noted in this procedure report.     Findings:  Somewhat dilated and patulous and aperistaltic esophagus. There was friability of GE junction with some distal LA-B erosive esophagitis.  Some retained food in stomach, obscuring some views. Several 5-18mm sized benign-appearing polyps seen in proximal stomach, and sampled with cold biopsy forceps.  Remainder of esophagus, stomach, pylorus and duodenum to the second portion was normal.              The scope was then withdrawn from the patient and the procedure completed.  COMPLICATIONS: There were no immediate complications.  ENDOSCOPIC IMPRESSION:     As above.  RECOMMENDATIONS:     1.  Watch for potential complications of procedure. 2.  Await biopsy results. 3.  Proceed with colonoscopy today.  eSigned:  Arta Silence, MD 12-Jul-2015 2:24 PM   CC:  CPT CODES: ICD CODES:  The ICD and CPT codes recommended by this software are interpretations from the data that the clinical staff has captured with the software.  The verification of the translation of this report to the ICD and  CPT codes and modifiers is the sole responsibility of the health care institution and practicing physician where this report was generated.  [employer]. will not be held responsible for the validity of the ICD and CPT codes included on this report.  AMA assumes no liability for data contained or not contained herein. CPT is a Designer, television/film set of the [employer].

## 2015-06-13 NOTE — Progress Notes (Unsigned)
I was contacted today by Dr. Watt Climes concerning patient. He was recently seen by Dr. Percival Spanish in June for evaluation of bradycardia. A Holter monitor apparently showed sinus with some bradycardia predominantly at night. There were occasional nonconducted PACs. Patient was scheduled to have an exercise treadmill rule out chronotropic incompetence. Patient presented for colonoscopy and EGD today. He was noted to have bradycardia on telemetry. I was faxed rhythm strips that showed sinus with occasional sinus pauses. The patient is asymptomatic. There is no high degree AV block. I do not think there is a contraindication to performing his procedures. These will be planned for the hospital instead of as an outpatient. Copies of the strips have been placed in the chart. Kirk Ruths

## 2015-06-13 NOTE — Interval H&P Note (Signed)
History and Physical Interval Note:  06/13/2015 1:07 PM  Johnathan [name]  has presented today for surgery, with the diagnosis of .iron deficiency anemia  The various methods of treatment have been discussed with the patient and family. After consideration of risks, benefits and other options for treatment, the patient has consented to  Procedure(s): ESOPHAGOGASTRODUODENOSCOPY (EGD) WITH PROPOFOL (N/A) COLONOSCOPY WITH PROPOFOL (N/A) as a surgical intervention .  The patient's history has been reviewed, patient examined, no change in status, stable for surgery.  I have reviewed the patient's chart and labs.  Questions were answered to the patient's satisfaction.     Hiroyuki Ozanich M  Assessment:  1.  Anemia. 2.  History achalasia with esophageal ulcerations. 3.  Personal history of colon polyps.  Plan:  1.  Endoscopy. 2.  Risks (bleeding, infection, bowel perforation that could require surgery, sedation-related changes in cardiopulmonary systems), benefits (identification and possible treatment of source of symptoms, exclusion of certain causes of symptoms), and alternatives (watchful waiting, radiographic imaging studies, empiric medical treatment) of upper endoscopy (EGD) were explained to patient/family in detail and patient wishes to proceed. 3.  Colonoscopy. 4.  Risks (bleeding, infection, bowel perforation that could require surgery, sedation-related changes in cardiopulmonary systems), benefits (identification and possible treatment of source of symptoms, exclusion of certain causes of symptoms), and alternatives (watchful waiting, radiographic imaging studies, empiric medical treatment) of colonoscopy were explained to patient/family in detail and patient wishes to proceed.

## 2015-06-13 NOTE — Anesthesia Postprocedure Evaluation (Signed)
  Anesthesia Post-op Note  Patient: Johnathan [name]  Procedure(s) Performed: Procedure(s) (LRB): ESOPHAGOGASTRODUODENOSCOPY (EGD) WITH PROPOFOL (N/A) COLONOSCOPY WITH PROPOFOL (N/A)  Patient Location: PACU  Anesthesia Type: MAC  Level of Consciousness: awake and alert   Airway and Oxygen Therapy: Patient Spontanous Breathing  Post-op Pain: mild  Post-op Assessment: Post-op Vital signs reviewed, Patient's Cardiovascular Status Stable, Respiratory Function Stable, Patent Airway and No signs of Nausea or vomiting  Last Vitals:  Filed Vitals:   06/13/15 1430  BP: 109/72  Pulse: 60  Temp:   Resp: 17    Post-op Vital Signs: stable   Complications: No apparent anesthesia complications

## 2015-06-13 NOTE — Transfer of Care (Signed)
Immediate Anesthesia Transfer of Care Note  Patient: Johnathan [name]  Procedure(s) Performed: Procedure(s): ESOPHAGOGASTRODUODENOSCOPY (EGD) WITH PROPOFOL (N/A) COLONOSCOPY WITH PROPOFOL (N/A)  Patient Location: PACU and Endoscopy Unit  Anesthesia Type:MAC  Level of Consciousness: awake, oriented, patient cooperative, lethargic and responds to stimulation  Airway & Oxygen Therapy: Patient Spontanous Breathing and Patient connected to nasal cannula oxygen  Post-op Assessment: Report given to RN, Post -op Vital signs reviewed and stable and Patient moving all extremities  Post vital signs: Reviewed and stable  Last Vitals:  Filed Vitals:   06/13/15 1307  BP: 158/73  Pulse: 55  Temp: 36.6 C  Resp: 18    Complications: No apparent anesthesia complications

## 2015-06-13 NOTE — Telephone Encounter (Signed)
This encounter was created in error - please disregard.

## 2015-06-13 NOTE — H&P (View-Only) (Signed)
Cardiology Office Note   Date:  05/19/2015   ID:  BRENTLEE DELAGE, DOB 08/26/1946, MRN [id number]  PCP:  [name], MD  Cardiologist:   Minus Breeding, MD   Chief Complaint  Patient presents with  . Fatigue      History of Present Illness: Johnathan [name] is a 69 y.o. male who presents for evaluation of bradycardia. His biggest complaint has been fatigue. I saw him for this and he did have a monitor which demonstrated some sinus bradycardia, this was particularly at night. He did have PACs and this is noted on EKG. Some of these are blocked. There were no however sustained or necessarily symptomatic dysrhythmias. He appeared to have chronotropic competence. Echocardiogram demonstrated some mild mitral regurgitation. He does have a chronic anemia which is mild. He tells me recently his ferritin level was very low. He had only been taking this medicine supplement periodically but he started taking iron daily now. He still very tired. 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 He's not having any presyncope or syncope. He denies any chest pressure, neck or arm discomfort. He's not feeling the palpitations. He has no new shortness of breath. He's had some trace chronic edema.  Past Medical History  Diagnosis Date  . Bradycardia   . Anemia   . Hiatal hernia   . GERD (gastroesophageal reflux disease)   . Herniated disc, cervical   . H/O: GI bleed   . Metacarpal bone fracture   . Rosacea, acne   . Hyperlipidemia     Past Surgical History  Procedure Laterality Date  . Hernia repair    . Nissen fundoplication  9470  . Cervical fusion    . Varicocele excision Left   . Myotomy       Current Outpatient Prescriptions  Medication Sig Dispense Refill  . Cholecalciferol (VITAMIN D3) 5000 UNITS CAPS Take 1 capsule by mouth daily.    . Iron-FA-B Cmp-C-Biot-Probiotic (FUSION PLUS) CAPS As Needed  1  . omeprazole (PRILOSEC) 40 MG capsule Take 40 mg  by mouth daily.     No current facility-administered medications for this visit.    Allergies:   Review of patient's allergies indicates no known allergies.    Social History:  The patient  reports that he has never smoked. He does not have any smokeless tobacco history on file.   Family History:  The patient's family history includes Breast cancer in his mother; Diabetes in his father and paternal grandmother; Heart disease in his father.    ROS:  Please see the history of present illness.   Otherwise, review of systems are positive for reflux.   All other systems are reviewed and negative.    PHYSICAL EXAM: VS:  BP 112/70 mmHg  Pulse 98  Ht 5\' 8"  (1.727 m)  Wt 143 lb 11.2 oz (65.182 kg)  BMI 21.85 kg/m2 , BMI Body mass index is 21.85 kg/(m^2). GENERAL:  Well appearing HEENT:  Pupils equal round and reactive, fundi not visualized, oral mucosa unremarkable NECK:  No jugular venous distention, waveform within normal limits, carotid upstroke brisk and symmetric, no bruits, no thyromegaly LUNGS:  Clear to auscultation bilaterally CHEST:  Unremarkable HEART:  PMI not displaced or sustained,S1 and S2 within normal limits, no S3, no S4, no clicks, no rubs, 2 out of 6 apical systolic murmur heard early in systole and radiating slightly to the apex murmurs ABD:  Flat, positive bowel sounds  normal in frequency in pitch, no bruits, no rebound, no guarding, no midline pulsatile mass, no hepatomegaly, no splenomegaly EXT:  2 plus pulses throughout, no edema, no cyanosis no clubbing    Recent Labs: See below.  Labs from primary provider reviewed.    Lipid Panel    Wt Readings from Last 3 Encounters:  05/19/15 143 lb 11.2 oz (65.182 kg)  05/02/15 143 lb 1.6 oz (64.91 kg)      Other studies Reviewed: Additional studies/ records that were reviewed today include: Holter and labs Review of the above records demonstrates:  Please see elsewhere in the note.     ASSESSMENT AND  PLAN:  ARRHYTHMIA:   I don't strongly suspect this is causing his tiredness. However, I'm going to bring him back and do a POET (Plain Old Exercise Treadmill) to make sure he doesn't have any exercise-induced dysrhythmia. At this point he does not have an indication for pacing.  MURMUR:  He had mild mitral valve regurgitation on echo and we will follow this clinically.  FATIGUE:  Along with stress testing above I will order a testosterone level. If none of this is revealing I might order a sleep study.   Current medicines are reviewed at length with the patient today.  The patient does not have concerns regarding medicines.  The following changes have been made:  no change  Labs/ tests ordered today include:   Orders Placed This Encounter  Procedures  . Testosterone  . Exercise Tolerance Test     Disposition:   FU with me after this testing.    Signed, Minus Breeding, MD  05/19/2015 3:39 PM    [facility]

## 2015-06-13 NOTE — Op Note (Signed)
[facility] [place], [address]   COLONOSCOPY PROCEDURE REPORT  PATIENT: Johnathan, [name]  MR#: [id number] BIRTHDATE: 12/15/1945 , 25  yrs. old GENDER: male ENDOSCOPIST: Arta Silence, MD REFERRED ME:QAST Magod, M.D. PROCEDURE DATE:  06-23-2015 PROCEDURE:   Colonoscopy, hot snare polypectomy ASA CLASS:   Class II INDICATIONS:iron-deficiency anemia, personal history of colonic polyps. MEDICATIONS: Monitored anesthesia care  DESCRIPTION OF PROCEDURE:   After the risks benefits and alternatives of the procedure were thoroughly explained, informed consent was obtained.  Mild external hemorrhoids, otherwise normal digital rectal exam.   The pediatric colonoscope was introduced through the anus and advanced to the cecum, which was identified by both the appendix and ileocecal valve. No adverse events experienced.   The quality of the prep was good.  The instrument was then slowly withdrawn as the colon was fully examined. Estimated blood loss is zero unless otherwise noted in this procedure report.    Findings:  External hemorrhoids, otherwise normal digital rectal exam.  Diffuse melanosis coli.  23mm ascending colon polyp and 75mm descending colon, removed with snare cautery.  No other polyps, masses, vascular ectasias, or inflammatory changes were seen. Retroflexed view of rectum was normal.         Withdrawal time was 21 minutes     .  The scope was withdrawn and the procedure completed.  COMPLICATIONS:  ENDOSCOPIC IMPRESSION:     As above.  RECOMMENDATIONS:     1.  Watch for potential complications of procedure. 2.  Await polypectomy results. 3.  Likely repeat colonoscopy 5 years, pending polypectomy findings. 4.  CBC and ferritin today. 5.  Follow-up with Dr. Watt Climes.  eSigned:  Arta Silence, MD 2015-06-23 2:30 PM   cc:  CPT CODES: ICD CODES:  The ICD and CPT codes recommended by this software are interpretations from the data  that the clinical staff has captured with the software.  The verification of the translation of this report to the ICD and CPT codes and modifiers is the sole responsibility of the health care institution and practicing physician where this report was generated.  [employer] will not be held responsible for the validity of the ICD and CPT codes included on this report.  AMA assumes no liability for data contained or not contained herein. CPT is a Designer, television/film set of the [employer].

## 2015-06-14 ENCOUNTER — Telehealth (HOSPITAL_COMMUNITY): Payer: Self-pay

## 2015-06-14 ENCOUNTER — Encounter: Payer: Self-pay | Admitting: Cardiology

## 2015-06-14 NOTE — Telephone Encounter (Signed)
Encounter complete. 

## 2015-06-16 ENCOUNTER — Encounter (HOSPITAL_COMMUNITY): Payer: Self-pay | Admitting: *Deleted

## 2015-06-16 ENCOUNTER — Encounter (HOSPITAL_COMMUNITY): Payer: Self-pay | Admitting: Gastroenterology

## 2015-06-16 ENCOUNTER — Ambulatory Visit (HOSPITAL_COMMUNITY)
Admission: RE | Admit: 2015-06-16 | Discharge: 2015-06-16 | Disposition: A | Discharge status: Home / Self Care | Payer: Managed Care, Other (non HMO) | Source: Ambulatory Visit | Attending: Cardiovascular Disease | Admitting: Cardiovascular Disease

## 2015-06-16 DIAGNOSIS — R9439 Abnormal result of other cardiovascular function study: Secondary | ICD-10-CM | POA: Insufficient documentation

## 2015-06-16 DIAGNOSIS — I499 Cardiac arrhythmia, unspecified: Secondary | ICD-10-CM

## 2015-06-16 NOTE — Progress Notes (Unsigned)
Patient had greater than 2.0 mm ST depression. Showed study to Dr. Claiborne Billings, he said it was positive and the Patient is to see Dr. Percival Spanish.  Escorted him to a scheduler to make an appointment.

## 2015-06-17 ENCOUNTER — Ambulatory Visit (INDEPENDENT_AMBULATORY_CARE_PROVIDER_SITE_OTHER): Payer: Managed Care, Other (non HMO) | Admitting: Cardiology

## 2015-06-17 ENCOUNTER — Other Ambulatory Visit: Payer: Self-pay | Admitting: *Deleted

## 2015-06-17 ENCOUNTER — Encounter: Payer: Self-pay | Admitting: Cardiovascular Disease

## 2015-06-17 VITALS — BP 106/60 | HR 63 | Ht 68.0 in | Wt 142.0 lb

## 2015-06-17 DIAGNOSIS — Z01818 Encounter for other preprocedural examination: Secondary | ICD-10-CM | POA: Diagnosis not present

## 2015-06-17 DIAGNOSIS — R931 Abnormal findings on diagnostic imaging of heart and coronary circulation: Secondary | ICD-10-CM

## 2015-06-17 DIAGNOSIS — R9439 Abnormal result of other cardiovascular function study: Secondary | ICD-10-CM

## 2015-06-17 LAB — COMPLETE METABOLIC PANEL WITH GFR
ALBUMIN: 3.9 g/dL (ref 3.5–5.2)
ALT: 11 U/L (ref 0–53)
AST: 16 U/L (ref 0–37)
Alkaline Phosphatase: 49 U/L (ref 39–117)
BUN: 16 mg/dL (ref 6–23)
CHLORIDE: 105 meq/L (ref 96–112)
CO2: 27 mEq/L (ref 19–32)
CREATININE: 0.88 mg/dL (ref 0.50–1.35)
Calcium: 9.1 mg/dL (ref 8.4–10.5)
GFR, EST NON AFRICAN AMERICAN: 89 mL/min
GLUCOSE: 61 mg/dL — AB (ref 70–99)
POTASSIUM: 4.2 meq/L (ref 3.5–5.3)
Sodium: 143 mEq/L (ref 135–145)
Total Bilirubin: 0.3 mg/dL (ref 0.2–1.2)
Total Protein: 6.5 g/dL (ref 6.0–8.3)

## 2015-06-17 LAB — CBC
HEMATOCRIT: 38.5 % — AB (ref 39.0–52.0)
Hemoglobin: 12.2 g/dL — ABNORMAL LOW (ref 13.0–17.0)
MCH: 26.6 pg (ref 26.0–34.0)
MCHC: 31.7 g/dL (ref 30.0–36.0)
MCV: 84.1 fL (ref 78.0–100.0)
MPV: 9 fL (ref 8.6–12.4)
Platelets: 261 10*3/uL (ref 150–400)
RBC: 4.58 MIL/uL (ref 4.22–5.81)
RDW: 20.4 % — AB (ref 11.5–15.5)
WBC: 4.8 10*3/uL (ref 4.0–10.5)

## 2015-06-17 LAB — EXERCISE TOLERANCE TEST
CHL CUP RESTING HR STRESS: 45 {beats}/min
CSEPED: 10 min
Estimated workload: 11.7 METS
MPHR: 153 {beats}/min
Peak HR: 150 {beats}/min
Percent HR: 98 %
RPE: 15

## 2015-06-17 LAB — PROTIME-INR
INR: 1 (ref <1.50)
PROTHROMBIN TIME: 13.2 s (ref 11.6–15.2)

## 2015-06-17 LAB — APTT: aPTT: 29 seconds (ref 24–37)

## 2015-06-17 NOTE — Progress Notes (Signed)
Cardiology Office Note   Date:  06/17/2015   ID:  Johnathan [name], DOB 1946-10-08, MRN [id number]  PCP:  [name], MD  Cardiologist:   Minus Breeding, MD   Chief Complaint  Patient presents with  . Abnormal stress test      History of Present Illness: NOTNAMED Johnathan [name] is a 69 y.o. male who presents for evaluation of bradycardia and fatigue. Holter did not demonstrate symptomatic dysrhythmias. He appeared to have chronotropic competence. Echocardiogram demonstrated some mild mitral regurgitation. He does have a chronic anemia which is mild. He has felt better since being on iron supplements but he had not gotten back to baseline. He has a very low ferritin. Because of his ongoing fatigue and some dyspnea with exertion and decreased exercise tolerance with inability to do his standard work without becoming profoundly fatigue I did put him on a treadmill. This was abnormal. He had frequent ectopy. He had ST segment depression at peak exercise.  Past Medical History  Diagnosis Date  . Bradycardia   . Anemia   . Hiatal hernia   . GERD (gastroesophageal reflux disease)   . Herniated disc, cervical   . H/O: GI bleed   . Metacarpal bone fracture   . Rosacea, acne   . Hyperlipidemia     Past Surgical History  Procedure Laterality Date  . Hernia repair    . Nissen fundoplication  0865  . Cervical fusion    . Varicocele excision Left   . Myotomy    . Esophagogastroduodenoscopy (egd) with propofol N/A 06/13/2015    Procedure: ESOPHAGOGASTRODUODENOSCOPY (EGD) WITH PROPOFOL;  Surgeon: Arta Silence, MD;  Location: [facility];  Service: Endoscopy;  Laterality: N/A;  . Colonoscopy with propofol N/A 06/13/2015    Procedure: COLONOSCOPY WITH PROPOFOL;  Surgeon: Arta Silence, MD;  Location: [facility];  Service: Endoscopy;  Laterality: N/A;     Current Outpatient Prescriptions  Medication Sig Dispense Refill  . Cholecalciferol (VITAMIN D3) 5000 UNITS CAPS Take 1  capsule by mouth daily.    . Iron-FA-B Cmp-C-Biot-Probiotic (FUSION PLUS) CAPS As Needed  1  . omeprazole (PRILOSEC) 40 MG capsule Take 40 mg by mouth daily.     No current facility-administered medications for this visit.    Allergies:   Review of patient's allergies indicates no known allergies.    ROS:  Please see the history of present illness.   Otherwise, review of systems are positive for reflux.   All other systems are reviewed and negative.    PHYSICAL EXAM: VS:  BP 106/60 mmHg  Pulse 63  Ht 5\' 8"  (1.727 m)  Wt 142 lb (64.411 kg)  BMI 21.60 kg/m2 , BMI Body mass index is 21.6 kg/(m^2). GENERAL:  Well appearing HEENT:  Pupils equal round and reactive, fundi not visualized, oral mucosa unremarkable NECK:  No jugular venous distention, waveform within normal limits, carotid upstroke brisk and symmetric, no bruits, no thyromegaly LUNGS:  Clear to auscultation bilaterally CHEST:  Unremarkable HEART:  PMI not displaced or sustained,S1 and S2 within normal limits, no S3, no S4, no clicks, no rubs, 2 out of 6 apical systolic murmur heard early in systole and radiating slightly to the apex murmurs ABD:  Flat, positive bowel sounds normal in frequency in pitch, no bruits, no rebound, no guarding, no midline pulsatile mass, no hepatomegaly, no splenomegaly EXT:  2 plus pulses throughout, no edema, no cyanosis no clubbing    Recent Labs: See below.  Labs from primary provider  reviewed.    Lipid Panel    Wt Readings from Last 3 Encounters:  06/17/15 142 lb (64.411 kg)  06/13/15 143 lb (64.864 kg)  05/19/15 143 lb 11.2 oz (65.182 kg)      Other studies Reviewed: Additional studies/ records that were reviewed today include: I personally reviewed the POET (Plain Old Exercise Treadmill) tracings. Review of the above records demonstrates:  Please see elsewhere in the note.     ASSESSMENT AND PLAN:  ABNORMAL POET (Plain Old Exercise Treadmill):  Given this and his symptoms  which could be considered unstable  cardiac catheterization is indicated. The patient understands that risks included but are not limited to stroke (1 in 1000), death (1 in 73), kidney failure [usually temporary] (1 in 500), bleeding (1 in 200), allergic reaction [possibly serious] (1 in 200).  The patient understands and agrees to proceed.   ARRHYTHMIA:   I don't strongly suspect this is causing his tiredness.  At this point no further workup specifically for this is indicated.  MURMUR:  He had mild mitral valve regurgitation on echo and we will follow this clinically.  FATIGUE:  He has had a low normal testosterone level. He's feeling a little bit better he will let his ferritin level increase before considering further workup. Of note he reports a colonoscopy and endoscopy which demonstrated no acute GI blood loss. What I would consider sleep study if he has continued fatigue.   Current medicines are reviewed at length with the patient today.  The patient does not have concerns regarding medicines.  The following changes have been made:  no change  Labs/ tests ordered today include:     Orders Placed This Encounter  Procedures  . COMPLETE METABOLIC PANEL WITH GFR  . CBC  . TSH  . Protime-INR  . PTT  . LEFT HEART CATHETERIZATION WITH CORONARY ANGIOGRAM     Disposition:   FU with me after this testing.    Signed, Minus Breeding, MD  06/17/2015 10:50 AM    [place]

## 2015-06-17 NOTE — Patient Instructions (Signed)
Your physician has requested that you have a cardiac catheterization. Cardiac catheterization is used to diagnose and/or treat various heart conditions. Doctors may recommend this procedure for a number of different reasons. The most common reason is to evaluate chest pain. Chest pain can be a symptom of coronary artery disease (CAD), and cardiac catheterization can show whether plaque is narrowing or blocking your heart's arteries. This procedure is also used to evaluate the valves, as well as measure the blood flow and oxygen levels in different parts of your heart. For further information please visit www.cardiosmart.org. Please follow instruction sheet, as given.  Your physician recommends that you return for lab work   

## 2015-06-18 LAB — TSH: TSH: 0.647 u[IU]/mL (ref 0.350–4.500)

## 2015-06-20 ENCOUNTER — Ambulatory Visit (HOSPITAL_COMMUNITY)
Admission: RE | Admit: 2015-06-20 | Discharge: 2015-06-20 | Disposition: A | Discharge status: Home / Self Care | Payer: Managed Care, Other (non HMO) | Source: Ambulatory Visit | Attending: Cardiovascular Disease | Admitting: Cardiovascular Disease

## 2015-06-20 ENCOUNTER — Encounter (HOSPITAL_COMMUNITY)
Admission: RE | Disposition: A | Discharge status: Home / Self Care | Payer: Self-pay | Source: Ambulatory Visit | Attending: Cardiovascular Disease

## 2015-06-20 ENCOUNTER — Encounter (HOSPITAL_COMMUNITY): Payer: Self-pay | Admitting: Cardiovascular Disease

## 2015-06-20 DIAGNOSIS — E785 Hyperlipidemia, unspecified: Secondary | ICD-10-CM | POA: Diagnosis not present

## 2015-06-20 DIAGNOSIS — R9439 Abnormal result of other cardiovascular function study: Secondary | ICD-10-CM | POA: Insufficient documentation

## 2015-06-20 DIAGNOSIS — K219 Gastro-esophageal reflux disease without esophagitis: Secondary | ICD-10-CM | POA: Diagnosis not present

## 2015-06-20 DIAGNOSIS — R5383 Other fatigue: Secondary | ICD-10-CM | POA: Insufficient documentation

## 2015-06-20 DIAGNOSIS — R9430 Abnormal result of cardiovascular function study, unspecified: Secondary | ICD-10-CM | POA: Diagnosis not present

## 2015-06-20 DIAGNOSIS — K449 Diaphragmatic hernia without obstruction or gangrene: Secondary | ICD-10-CM | POA: Insufficient documentation

## 2015-06-20 DIAGNOSIS — D649 Anemia, unspecified: Secondary | ICD-10-CM | POA: Diagnosis not present

## 2015-06-20 HISTORY — PX: CARDIAC CATHETERIZATION: SHX172

## 2015-06-20 SURGERY — LEFT HEART CATH AND CORONARY ANGIOGRAPHY

## 2015-06-20 MED ORDER — SODIUM CHLORIDE 0.9 % IV SOLN: 250.0000 mL | INTRAVENOUS | Status: DC | PRN

## 2015-06-20 MED ORDER — ACETAMINOPHEN 325 MG PO TABS: 650.0000 mg | ORAL_TABLET | ORAL | Status: DC | PRN

## 2015-06-20 MED ORDER — SODIUM CHLORIDE 0.9 % IV SOLN
INTRAVENOUS | Status: DC
  Administered 2015-06-20 (×2): via INTRAVENOUS

## 2015-06-20 MED ORDER — FENTANYL CITRATE (PF) 100 MCG/2ML IJ SOLN
INTRAMUSCULAR | Status: AC
  Filled 2015-06-20: qty 2

## 2015-06-20 MED ORDER — SODIUM CHLORIDE 0.9 % WEIGHT BASED INFUSION: 3.0000 mL/kg/h | INTRAVENOUS | Status: DC

## 2015-06-20 MED ORDER — SODIUM CHLORIDE 0.9 % IJ SOLN: 3.0000 mL | INTRAMUSCULAR | Status: DC | PRN

## 2015-06-20 MED ORDER — ONDANSETRON HCL 4 MG/2ML IJ SOLN: 4.0000 mg | Freq: Four times a day (QID) | INTRAMUSCULAR | Status: DC | PRN

## 2015-06-20 MED ORDER — FENTANYL CITRATE (PF) 100 MCG/2ML IJ SOLN
INTRAMUSCULAR | Status: DC | PRN
  Administered 2015-06-20: 50 ug via INTRAVENOUS

## 2015-06-20 MED ORDER — HEPARIN SODIUM (PORCINE) 1000 UNIT/ML IJ SOLN
INTRAMUSCULAR | Status: AC
  Filled 2015-06-20: qty 1

## 2015-06-20 MED ORDER — VERAPAMIL HCL 2.5 MG/ML IV SOLN
INTRAVENOUS | Status: AC
  Filled 2015-06-20: qty 2

## 2015-06-20 MED ORDER — MIDAZOLAM HCL 2 MG/2ML IJ SOLN
INTRAMUSCULAR | Status: AC
  Filled 2015-06-20: qty 2

## 2015-06-20 MED ORDER — HEPARIN SODIUM (PORCINE) 1000 UNIT/ML IJ SOLN
INTRAMUSCULAR | Status: DC | PRN
  Administered 2015-06-20: 3200 [IU] via INTRAVENOUS

## 2015-06-20 MED ORDER — RADIAL COCKTAIL (HEPARIN/VERAPAMIL/LIDOCAINE/NITRO)
Status: DC | PRN
  Administered 2015-06-20: 1 via INTRA_ARTERIAL

## 2015-06-20 MED ORDER — ASPIRIN 81 MG PO CHEW
CHEWABLE_TABLET | ORAL | Status: AC
  Filled 2015-06-20: qty 1

## 2015-06-20 MED ORDER — MIDAZOLAM HCL 2 MG/2ML IJ SOLN
INTRAMUSCULAR | Status: DC | PRN
  Administered 2015-06-20: 2 mg via INTRAVENOUS

## 2015-06-20 MED ORDER — IOHEXOL 350 MG/ML SOLN
INTRAVENOUS | Status: DC | PRN
  Administered 2015-06-20: 65 mL via INTRA_ARTERIAL

## 2015-06-20 MED ORDER — ASPIRIN 81 MG PO CHEW
81.0000 mg | CHEWABLE_TABLET | ORAL | Status: AC
Start: 2015-06-20 — End: 2015-06-20
  Administered 2015-06-20: 81 mg via ORAL

## 2015-06-20 MED ORDER — NITROGLYCERIN 1 MG/10 ML FOR IR/CATH LAB
INTRA_ARTERIAL | Status: AC
  Filled 2015-06-20: qty 10

## 2015-06-20 MED ORDER — SODIUM CHLORIDE 0.9 % IJ SOLN: 3.0000 mL | Freq: Two times a day (BID) | INTRAMUSCULAR | Status: DC

## 2015-06-20 SURGICAL SUPPLY — 13 items
CATH INFINITI 5FR ANG PIGTAIL (CATHETERS) ×3 IMPLANT
CATH INFINITI 5FR MULTPACK ANG (CATHETERS) IMPLANT
CATH OPTITORQUE TIG 4.0 5F (CATHETERS) ×3 IMPLANT
DEVICE RAD COMP TR BAND LRG (VASCULAR PRODUCTS) ×3 IMPLANT
GLIDESHEATH SLEND A-KIT 6F 22G (SHEATH) ×3 IMPLANT
KIT HEART LEFT (KITS) ×3 IMPLANT
PACK CARDIAC CATHETERIZATION (CUSTOM PROCEDURE TRAY) ×3 IMPLANT
SHEATH PINNACLE 5F 10CM (SHEATH) IMPLANT
SYR MEDRAD MARK V 150ML (SYRINGE) ×3 IMPLANT
TRANSDUCER W/STOPCOCK (MISCELLANEOUS) ×3 IMPLANT
TUBING CIL FLEX 10 FLL-RA (TUBING) ×3 IMPLANT
WIRE EMERALD 3MM-J .035X150CM (WIRE) IMPLANT
WIRE SAFE-T 1.5MM-J .035X260CM (WIRE) ×3 IMPLANT

## 2015-06-20 NOTE — Interval H&P Note (Signed)
Cath Lab Visit (complete for each Cath Lab visit)  Clinical Evaluation Leading to the Procedure:   ACS: No.  Non-ACS:    Anginal Classification: CCS II  Anti-ischemic medical therapy: No Therapy  Non-Invasive Test Results: High-risk stress test findings: cardiac mortality >3%/year  Prior CABG: No previous CABG      History and Physical Interval Note:  06/20/2015 9:02 AM  Johnathan [name]  has presented today for surgery, with the diagnosis of Abnormal Stress Test  The various methods of treatment have been discussed with the patient and family. After consideration of risks, benefits and other options for treatment, the patient has consented to  Procedure(s): Left Heart Cath and Coronary Angiography (N/A) as a surgical intervention .  The patient's history has been reviewed, patient examined, no change in status, stable for surgery.  I have reviewed the patient's chart and labs.  Questions were answered to the patient's satisfaction.     Johnathan [name] A

## 2015-06-20 NOTE — Discharge Instructions (Signed)
Radial Site Care °Refer to this sheet in the next few weeks. These instructions provide you with information on caring for yourself after your procedure. Your caregiver may also give you more specific instructions. Your treatment has been planned according to current medical practices, but problems sometimes occur. Call your caregiver if you have any problems or questions after your procedure. °HOME CARE INSTRUCTIONS °· You may shower the day after the procedure. Remove the bandage (dressing) and gently wash the site with plain soap and water. Gently pat the site dry. °· Do not apply powder or lotion to the site. °· Do not submerge the affected site in water for 3 to 5 days. °· Inspect the site at least twice daily. °· Do not flex or bend the affected arm for 24 hours. °· No lifting over 5 pounds (2.3 kg) for 5 days after your procedure. °· Do not drive home if you are discharged the same day of the procedure. Have someone else drive you. °· You may drive 24 hours after the procedure unless otherwise instructed by your caregiver. °· Do not operate machinery or power tools for 24 hours. °· A responsible adult should be with you for the first 24 hours after you arrive home. °What to expect: °· Any bruising will usually fade within 1 to 2 weeks. °· Blood that collects in the tissue (hematoma) may be painful to the touch. It should usually decrease in size and tenderness within 1 to 2 weeks. °SEEK IMMEDIATE MEDICAL CARE IF: °· You have unusual pain at the radial site. °· You have redness, warmth, swelling, or pain at the radial site. °· You have drainage (other than a small amount of blood on the dressing). °· You have chills. °· You have a fever or persistent symptoms for more than 72 hours. °· You have a fever and your symptoms suddenly get worse. °· Your arm becomes pale, cool, tingly, or numb. °· You have heavy bleeding from the site. Hold pressure on the site. °Document Released: 12/15/2010 Document Revised:  02/04/2012 Document Reviewed: 12/15/2010 °ExitCare® Patient Information ©2015 [employer]. This information is not intended to replace advice given to you by your health care provider. Make sure you discuss any questions you have with your health care provider. ° °

## 2015-06-20 NOTE — H&P (View-Only) (Signed)
Cardiology Office Note   Date:  06/17/2015   ID:  Johnathan [name], DOB Dec 09, 1945, MRN [id number]  PCP:  [name], MD  Cardiologist:   Minus Breeding, MD   Chief Complaint  Patient presents with  . Abnormal stress test      History of Present Illness: Johnathan [name] is a 69 y.o. male who presents for evaluation of bradycardia and fatigue. Holter did not demonstrate symptomatic dysrhythmias. He appeared to have chronotropic competence. Echocardiogram demonstrated some mild mitral regurgitation. He does have a chronic anemia which is mild. He has felt better since being on iron supplements but he had not gotten back to baseline. He has a very low ferritin. Because of his ongoing fatigue and some dyspnea with exertion and decreased exercise tolerance with inability to do his standard work without becoming profoundly fatigue I did put him on a treadmill. This was abnormal. He had frequent ectopy. He had ST segment depression at peak exercise.  Past Medical History  Diagnosis Date  . Bradycardia   . Anemia   . Hiatal hernia   . GERD (gastroesophageal reflux disease)   . Herniated disc, cervical   . H/O: GI bleed   . Metacarpal bone fracture   . Rosacea, acne   . Hyperlipidemia     Past Surgical History  Procedure Laterality Date  . Hernia repair    . Nissen fundoplication  0093  . Cervical fusion    . Varicocele excision Left   . Myotomy    . Esophagogastroduodenoscopy (egd) with propofol N/A 06/13/2015    Procedure: ESOPHAGOGASTRODUODENOSCOPY (EGD) WITH PROPOFOL;  Surgeon: Arta Silence, MD;  Location: [facility];  Service: Endoscopy;  Laterality: N/A;  . Colonoscopy with propofol N/A 06/13/2015    Procedure: COLONOSCOPY WITH PROPOFOL;  Surgeon: Arta Silence, MD;  Location: [facility];  Service: Endoscopy;  Laterality: N/A;     Current Outpatient Prescriptions  Medication Sig Dispense Refill  . Cholecalciferol (VITAMIN D3) 5000 UNITS CAPS Take 1  capsule by mouth daily.    . Iron-FA-B Cmp-C-Biot-Probiotic (FUSION PLUS) CAPS As Needed  1  . omeprazole (PRILOSEC) 40 MG capsule Take 40 mg by mouth daily.     No current facility-administered medications for this visit.    Allergies:   Review of patient's allergies indicates no known allergies.    ROS:  Please see the history of present illness.   Otherwise, review of systems are positive for reflux.   All other systems are reviewed and negative.    PHYSICAL EXAM: VS:  BP 106/60 mmHg  Pulse 63  Ht 5\' 8"  (1.727 m)  Wt 142 lb (64.411 kg)  BMI 21.60 kg/m2 , BMI Body mass index is 21.6 kg/(m^2). GENERAL:  Well appearing HEENT:  Pupils equal round and reactive, fundi not visualized, oral mucosa unremarkable NECK:  No jugular venous distention, waveform within normal limits, carotid upstroke brisk and symmetric, no bruits, no thyromegaly LUNGS:  Clear to auscultation bilaterally CHEST:  Unremarkable HEART:  PMI not displaced or sustained,S1 and S2 within normal limits, no S3, no S4, no clicks, no rubs, 2 out of 6 apical systolic murmur heard early in systole and radiating slightly to the apex murmurs ABD:  Flat, positive bowel sounds normal in frequency in pitch, no bruits, no rebound, no guarding, no midline pulsatile mass, no hepatomegaly, no splenomegaly EXT:  2 plus pulses throughout, no edema, no cyanosis no clubbing    Recent Labs: See below.  Labs from primary provider  reviewed.    Lipid Panel    Wt Readings from Last 3 Encounters:  06/17/15 142 lb (64.411 kg)  06/13/15 143 lb (64.864 kg)  05/19/15 143 lb 11.2 oz (65.182 kg)      Other studies Reviewed: Additional studies/ records that were reviewed today include: I personally reviewed the POET (Plain Old Exercise Treadmill) tracings. Review of the above records demonstrates:  Please see elsewhere in the note.     ASSESSMENT AND PLAN:  ABNORMAL POET (Plain Old Exercise Treadmill):  Given this and his symptoms  which could be considered unstable  cardiac catheterization is indicated. The patient understands that risks included but are not limited to stroke (1 in 1000), death (1 in 83), kidney failure [usually temporary] (1 in 500), bleeding (1 in 200), allergic reaction [possibly serious] (1 in 200).  The patient understands and agrees to proceed.   ARRHYTHMIA:   I don't strongly suspect this is causing his tiredness.  At this point no further workup specifically for this is indicated.  MURMUR:  He had mild mitral valve regurgitation on echo and we will follow this clinically.  FATIGUE:  He has had a low normal testosterone level. He's feeling a little bit better he will let his ferritin level increase before considering further workup. Of note he reports a colonoscopy and endoscopy which demonstrated no acute GI blood loss. What I would consider sleep study if he has continued fatigue.   Current medicines are reviewed at length with the patient today.  The patient does not have concerns regarding medicines.  The following changes have been made:  no change  Labs/ tests ordered today include:     Orders Placed This Encounter  Procedures  . COMPLETE METABOLIC PANEL WITH GFR  . CBC  . TSH  . Protime-INR  . PTT  . LEFT HEART CATHETERIZATION WITH CORONARY ANGIOGRAM     Disposition:   FU with me after this testing.    Signed, Minus Breeding, MD  06/17/2015 10:50 AM    [place]

## 2015-06-24 ENCOUNTER — Telehealth: Payer: Self-pay | Admitting: Cardiology

## 2015-06-24 NOTE — Telephone Encounter (Signed)
Patient had Cath this past Monday, 7/25. He states the first couple of days his wrist was unremarkable, however he has noticed as of yesterday and today that he has "discoloration" to the site. He reports it is 1.5" x 3" with no swelling, no heat, no exudate, however the coloration is "dark like bruising". He states his wrist is "achy" too. Advised patient that discoloration at the entry site is expected, as per his discharge instructions. Discussed s/sx to look for regarding concerns, such as heat, swelling, bleeding or exudate at the site or tingling/poor circulation to the fingers of the affected wrist. Indicated he could use a pen to circle the bruised area to see if the bruising spreads. Reiterated normal healing times for bruises and tenderness. Advised he can use a cool compress to site (but not ice) for 5-10 minutes several times per day if he has discomfort. Patient will call back should he have any further concerns or problems.

## 2015-06-24 NOTE — Telephone Encounter (Signed)
Per  Pt call:   Pt would like to speak to someone about the discoloration and tenderness around the entry point of his CATH.  Pt will be at home till 1:30p today.  Pt does not have a cell.

## 2015-07-18 ENCOUNTER — Ambulatory Visit: Payer: Managed Care, Other (non HMO) | Admitting: Cardiology

## 2015-10-11 ENCOUNTER — Ambulatory Visit
Admission: RE | Admit: 2015-10-11 | Discharge: 2015-10-11 | Disposition: A | Discharge status: Home / Self Care | Payer: Managed Care, Other (non HMO) | Source: Ambulatory Visit | Attending: Family Medicine | Admitting: Family Medicine

## 2015-10-11 ENCOUNTER — Other Ambulatory Visit: Payer: Self-pay | Admitting: Family Medicine

## 2015-10-11 DIAGNOSIS — M25462 Effusion, left knee: Secondary | ICD-10-CM

## 2015-12-26 ENCOUNTER — Other Ambulatory Visit: Payer: Self-pay | Admitting: Family Medicine

## 2015-12-26 DIAGNOSIS — M25562 Pain in left knee: Secondary | ICD-10-CM

## 2016-01-02 ENCOUNTER — Other Ambulatory Visit: Payer: Managed Care, Other (non HMO)

## 2016-09-21 ENCOUNTER — Other Ambulatory Visit: Payer: Self-pay | Admitting: Family Medicine

## 2016-09-21 ENCOUNTER — Ambulatory Visit
Admission: RE | Admit: 2016-09-21 | Discharge: 2016-09-21 | Disposition: A | Discharge status: Home / Self Care | Payer: Medicare Other | Source: Ambulatory Visit | Attending: Family Medicine | Admitting: Family Medicine

## 2016-09-21 DIAGNOSIS — M542 Cervicalgia: Secondary | ICD-10-CM

## 2017-02-11 ENCOUNTER — Telehealth: Payer: Self-pay | Admitting: *Deleted

## 2017-02-11 NOTE — Telephone Encounter (Signed)
Faxed referral from David Swayne, MD of [facility] sent to scheduling 

## 2017-02-13 ENCOUNTER — Telehealth: Payer: Self-pay | Admitting: Cardiology

## 2017-02-13 NOTE — Telephone Encounter (Signed)
Received records from [facility] for appointment on 02/18/17 with Dr Percival Spanish.  Records put with Dr Hochrein's schedule for 02/18/17. lp

## 2017-02-17 NOTE — Progress Notes (Signed)
Cardiology Office Note   Date:  02/19/2017   ID:  ZARION OLIFF, DOB 1946/07/31, MRN [id number]  PCP:  [name], MD  Cardiologist:   Minus Breeding, MD   Chief Complaint  Patient presents with  . Fatigue      History of Present Illness: LENNON BOUTWELL is a 71 y.o. male who presents for follow up after cardiac cath.  He has bradycardia on a recent EKG.    I evaluated him for fatigue with a POET (Plain Old Exercise Treadmill).  He had a decreased exercise tolerance with inability to do his standard work without becoming profoundly fatigue and I did put him on a treadmill. This was abnormal. He had frequent ectopy. He had ST segment depression at peak exercise.  Cardiac cath was normal.      However, today he is scheduled to see me as an EKG on 3/19 demonstrated sinus bradycardia with rates in the 30s. He still feels fatigued. Some days are better than others. He'll feel tired during the day.  He has not had any syncope. He's not feeling any palpitations. He's not having any chest pressure, neck or arm discomfort.  Past Medical History:  Diagnosis Date  . Anemia   . Bradycardia   . GERD (gastroesophageal reflux disease)   . H/O: GI bleed   . Herniated disc, cervical   . Hiatal hernia   . Hyperlipidemia   . Metacarpal bone fracture   . Rosacea, acne     Past Surgical History:  Procedure Laterality Date  . CARDIAC CATHETERIZATION N/A 06/20/2015   Procedure: Left Heart Cath and Coronary Angiography;  Surgeon: Troy Sine, MD;  Location: [place] [facility];  Service: Cardiovascular;  Laterality: N/A;  . CERVICAL FUSION    . COLONOSCOPY WITH PROPOFOL N/A 06/13/2015   Procedure: COLONOSCOPY WITH PROPOFOL;  Surgeon: Arta Silence, MD;  Location: [facility];  Service: Endoscopy;  Laterality: N/A;  . ESOPHAGOGASTRODUODENOSCOPY (EGD) WITH PROPOFOL N/A 06/13/2015   Procedure: ESOPHAGOGASTRODUODENOSCOPY (EGD) WITH PROPOFOL;  Surgeon: Arta Silence, MD;  Location: [facility];  Service: Endoscopy;  Laterality: N/A;  . HERNIA REPAIR    . MYOTOMY    . NISSEN FUNDOPLICATION  3614  . VARICOCELE EXCISION Left      Current Outpatient Prescriptions  Medication Sig Dispense Refill  . azelastine (ASTELIN) 0.1 % nasal spray Place 1 spray into both nostrils as needed for rhinitis. Use in each nostril as directed    . Iron-FA-B Cmp-C-Biot-Probiotic (FUSION PLUS) CAPS As Needed  1  . omeprazole (PRILOSEC) 40 MG capsule Take 40 mg by mouth daily.     No current facility-administered medications for this visit.     Allergies:   Patient has no known allergies.    ROS:  Please see the history of present illness.   Otherwise, review of systems are positive for tingling in his fingers.  severe reflux.   All other systems are reviewed and negative.    PHYSICAL EXAM: VS:  BP 131/66   Pulse 68   Ht 5\' 8"  (1.727 m)   Wt 142 lb 3.2 oz (64.5 kg)   BMI 21.62 kg/m  , BMI Body mass index is 21.62 kg/m. GENERAL:  Well appearing, thin and in no distress NECK:  No jugular venous distention, waveform within normal limits, carotid upstroke brisk and symmetric, no bruits, no thyromegaly LUNGS:  Clear to auscultation bilaterally CHEST:  Unremarkable HEART:  PMI not displaced or sustained,S1 and S2 within  normal limits, no S3, no S4, no clicks, no rubs,  2 out of 6 apical systolic murmur heard early in systole and radiating slightly to the apex murmurs ABD:  Flat, positive bowel sounds normal in frequency in pitch, no bruits, no rebound, no guarding, no midline pulsatile mass, no hepatomegaly, no splenomegaly EXT:  2 plus pulses throughout, no edema, no cyanosis no clubbing   EKG:  02/11/17.  Sinus rhythm with sinus bradycardia intermittent, QTC slightly prolonged, low voltage on the limb leads, no acute ST-T wave changes.   Recent Labs: None   Lipid Panel    Wt Readings from Last 3 Encounters:  02/18/17 142 lb 3.2 oz (64.5 kg)  06/20/15 142 lb (64.4 kg)    06/17/15 142 lb (64.4 kg)      Other studies Reviewed: Additional studies/ records that were reviewed today include: EKG Review of the above records demonstrates:      ASSESSMENT AND PLAN:   ARRHYTHMIA:     He did have frequent PVCs during stress testing but no obstructive coronary disease on exam. Further workup as below.  MURMUR   He had mild mitral valve regurgitation.  I will repeat an echo.     FATIGUE:  He had some bradycardia previously on a Holter but this was only during the sleeping hours. Now he showing this while awake and I think a pacemaker is indicated for symptomatic sinus bradycardia. I will refer him for EP evaluation.   Of interest he was able to get his heart rate up during his treadmill. However, the heart rate recorded while at his primary care office is in appropriate and I think consistent with his symptoms. Interestingly he also has some tingling in his hands which has been long-standing. There was no evidence of previous echo of any infiltrative process or systemic disease to suggest correlation with his bradycardia. However, he has an extensive lab draw he reports by his primary provider and I will review these to see what kind of evaluation has been done. I am going to repeat an echocardiogram.   Current medicines are reviewed at length with the patient today.  The patient does not have concerns regarding medicines.  The following changes have been made:  none  Labs/ tests ordered today include:    Orders Placed This Encounter  Procedures  . ECHOCARDIOGRAM COMPLETE     Disposition:   FU with me after the EP evaluation and echo.  Ronnell Guadalajara, MD  02/19/2017 12:51 PM    [facility]

## 2017-02-18 ENCOUNTER — Ambulatory Visit (INDEPENDENT_AMBULATORY_CARE_PROVIDER_SITE_OTHER): Payer: Managed Care, Other (non HMO) | Admitting: Cardiology

## 2017-02-18 VITALS — BP 131/66 | HR 68 | Ht 68.0 in | Wt 142.2 lb

## 2017-02-18 DIAGNOSIS — I493 Ventricular premature depolarization: Secondary | ICD-10-CM

## 2017-02-18 DIAGNOSIS — R001 Bradycardia, unspecified: Secondary | ICD-10-CM | POA: Diagnosis not present

## 2017-02-18 DIAGNOSIS — R5383 Other fatigue: Secondary | ICD-10-CM

## 2017-02-18 NOTE — Patient Instructions (Addendum)
Medication Instructions:  Continue current medications  Labwork: None Ordered  Testing/Procedures: Your physician has requested that you have an echocardiogram. Echocardiography is a painless test that uses sound waves to create images of your heart. It provides your doctor with information about the size and shape of your heart and how well your heart's chambers and valves are working. This procedure takes approximately one hour. There are no restrictions for this procedure.  Follow-Up: Your physician recommends that you schedule a follow-up appointment in: Cardiac Electrophysiology   Any Other Special Instructions Will Be Listed Below (If Applicable).   If you need a refill on your cardiac medications before your next appointment, please call your pharmacy.

## 2017-02-19 ENCOUNTER — Encounter: Payer: Self-pay | Admitting: Cardiology

## 2017-03-05 ENCOUNTER — Ambulatory Visit: Payer: Managed Care, Other (non HMO) | Admitting: Cardiology

## 2017-03-07 ENCOUNTER — Ambulatory Visit: Payer: Managed Care, Other (non HMO) | Admitting: Cardiology

## 2017-03-11 ENCOUNTER — Ambulatory Visit (HOSPITAL_COMMUNITY): Payer: Managed Care, Other (non HMO) | Attending: Cardiology

## 2017-03-11 DIAGNOSIS — R001 Bradycardia, unspecified: Secondary | ICD-10-CM

## 2017-03-15 ENCOUNTER — Telehealth: Payer: Self-pay | Admitting: Cardiology

## 2017-03-15 NOTE — Telephone Encounter (Signed)
Patient called w/echo results 

## 2017-03-15 NOTE — Telephone Encounter (Signed)
Per pt returning this office call please give him a call back.

## 2017-03-18 ENCOUNTER — Other Ambulatory Visit: Payer: Self-pay | Admitting: Cardiology

## 2017-03-18 ENCOUNTER — Encounter: Payer: Self-pay | Admitting: Cardiology

## 2017-03-18 ENCOUNTER — Ambulatory Visit (INDEPENDENT_AMBULATORY_CARE_PROVIDER_SITE_OTHER): Payer: Managed Care, Other (non HMO) | Admitting: Cardiology

## 2017-03-18 ENCOUNTER — Encounter: Payer: Self-pay | Admitting: *Deleted

## 2017-03-18 VITALS — BP 124/62 | HR 37 | Ht 68.0 in | Wt 140.2 lb

## 2017-03-18 DIAGNOSIS — R001 Bradycardia, unspecified: Secondary | ICD-10-CM | POA: Diagnosis not present

## 2017-03-18 DIAGNOSIS — I495 Sick sinus syndrome: Secondary | ICD-10-CM

## 2017-03-18 NOTE — Patient Instructions (Addendum)
Medication Instructions:    Your physician recommends that you continue on your current medications as directed. Please refer to the Current Medication list given to you today.  Labwork:  Your physician recommends that you return for pre procedure lab work between 5/8 - 5/18 for: BMET & CBC w/ diff  Testing/Procedures: Your physician has recommended that you have a pacemaker inserted. A pacemaker is a small device that is placed under the skin of your chest or abdomen to help control abnormal heart rhythms. This device uses electrical pulses to prompt the heart to beat at a normal rate. Pacemakers are used to treat heart rhythms that are too slow. Wire (leads) are attached to the pacemaker that goes into the chambers of you heart. This is done in the hospital and usually requires and overnight stay. Please see the instruction sheet given to you today for more information.  Follow-Up:  Your physician recommends that you schedule a follow-up appointment in: 10-14 days, after your procedure on 04/16/17, with device clinic for a wound check.   Your physician recommends that you schedule a follow-up appointment in: 3 months. After your procedure on 04/16/17, with Dr. Curt Bears.  - If you need a refill on your cardiac medications before your next appointment, please call your pharmacy.   Thank you for choosing [facility]!!   Trinidad Curet, RN [phone]   Any Other Special Instructions Will Be Listed Below (If Applicable).   Pacemaker Implantation, Adult Pacemaker implantation is a procedure to place a pacemaker inside your chest. A pacemaker is a small computer that sends electrical signals to the heart and helps your heart beat normally. A pacemaker also stores information about your heart rhythms. You may need pacemaker implantation if you:  Have a slow heartbeat (bradycardia).  Faint (syncope).  Have shortness of breath (dyspnea) due to heart problems. The pacemaker attaches to  your heart through a wire, called a lead. Sometimes just one lead is needed. Other times, there will be two leads. There are two types of pacemakers:  Transvenous pacemaker. This type is placed under the skin or muscle of your chest. The lead goes through a vein in the chest area to reach the inside of the heart.  Epicardial pacemaker. This type is placed under the skin or muscle of your chest or belly. The lead goes through your chest to the outside of the heart. Tell a health care provider about:  Any allergies you have.  All medicines you are taking, including vitamins, herbs, eye drops, creams, and over-the-counter medicines.  Any problems you or family members have had with anesthetic medicines.  Any blood or bone disorders you have.  Any surgeries you have had.  Any medical conditions you have.  Whether you are pregnant or may be pregnant. What are the risks? Generally, this is a safe procedure. However, problems may occur, including:  Infection.  Bleeding.  Failure of the pacemaker or the lead.  Collapse of a lung or bleeding into a lung.  Blood clot inside a blood vessel with a lead.  Damage to the heart.  Infection inside the heart (endocarditis).  Allergic reactions to medicines. What happens before the procedure? Staying hydrated  Follow instructions from your health care provider about hydration, which may include:  Up to 2 hours before the procedure - you may continue to drink clear liquids, such as water, clear fruit juice, black coffee, and plain tea. Eating and drinking restrictions  Follow instructions from your health care provider  about eating and drinking, which may include:  8 hours before the procedure - stop eating heavy meals or foods such as meat, fried foods, or fatty foods.  6 hours before the procedure - stop eating light meals or foods, such as toast or cereal.  6 hours before the procedure - stop drinking milk or drinks that contain  milk.  2 hours before the procedure - stop drinking clear liquids. Medicines   Ask your health care provider about:  Changing or stopping your regular medicines. This is especially important if you are taking diabetes medicines or blood thinners.  Taking medicines such as aspirin and ibuprofen. These medicines can thin your blood. Do not take these medicines before your procedure if your health care provider instructs you not to.  You may be given antibiotic medicine to help prevent infection. General instructions   You will have a heart evaluation. This may include an electrocardiogram (ECG), chest X-ray, and heart imaging (echocardiogram,  or echo) tests.  You will have blood tests.  Do not use any products that contain nicotine or tobacco, such as cigarettes and e-cigarettes. If you need help quitting, ask your health care provider.  Plan to have someone take you home from the hospital or clinic.  If you will be going home right after the procedure, plan to have someone with you for 24 hours.  Ask your health care provider how your surgical site will be marked or identified. What happens during the procedure?  To reduce your risk of infection:  Your health care team will wash or sanitize their hands.  Your skin will be washed with soap.  Hair may be removed from the surgical area.  An IV tube will be inserted into one of your veins.  You will be given one or more of the following:  A medicine to help you relax (sedative).  A medicine to numb the area (local anesthetic).  A medicine to make you fall asleep (general anesthetic).  If you are getting a transvenous pacemaker:  An incision will be made in your upper chest.  A pocket will be made for the pacemaker. It may be placed under the skin or between layers of muscle.  The lead will be inserted into a blood vessel that returns to the heart.  While X-rays are taken by an imaging machine (fluoroscopy), the lead  will be advanced through the vein to the inside of your heart.  The other end of the lead will be tunneled under the skin and attached to the pacemaker.  If you are getting an epicardial pacemaker:  An incision will be made near your ribs or breastbone (sternum) for the lead.  The lead will be attached to the outside of your heart.  Another incision will be made in your chest or upper belly to create a pocket for the pacemaker.  The free end of the lead will be tunneled under the skin and attached to the pacemaker.  The transvenous or epicardial pacemaker will be tested. Imaging studies may be done to check the lead position.  The incisions will be closed with stitches (sutures), adhesive strips, or skin glue.  Bandages (dressing) will be placed over the incisions. The procedure may vary among health care providers and hospitals. What happens after the procedure?  Your blood pressure, heart rate, breathing rate, and blood oxygen level will be monitored until the medicines you were given have worn off.  You will be given antibiotics and pain medicine.  ECG and chest x-rays will be done.  You will wear a continuous type of ECG (Holter monitor) to check your heart rhythm.  Your health care provider willprogram the pacemaker.  Do not drive for 24 hours if you received a sedative. This information is not intended to replace advice given to you by your health care provider. Make sure you discuss any questions you have with your health care provider. Document Released: 11/02/2002 Document Revised: 06/01/2016 Document Reviewed: 04/25/2016 Elsevier Interactive Patient Education  2017 [employer].

## 2017-03-18 NOTE — Progress Notes (Signed)
Electrophysiology Office Note   Date:  03/18/2017   ID:  Johnathan [name], DOB [birth year], MRN [id number]  PCP:  [name], MD  Cardiologist:  Percival Spanish Primary Electrophysiologist:  Aubryn Spinola Meredith Leeds, MD    Chief Complaint  Patient presents with  . New Patient (Initial Visit)    Brady/PVC's     History of Present Illness: Johnathan [name] is a 71 y.o. male who is being seen today for the evaluation of sinus bradycardia, PVCs at the request of Minus Breeding, MD. Presenting today for electrophysiology evaluation. He was evaluated with an exercise treadmill test and was found to have profound fatigue and decreased exercise tolerance. He had ST segment depression at peak exercise, but his cardiac catheterization was normal. He presented to cardiology clinic after an EKG on 3/19 performed by his PCP which showed sinus bradycardia with rates in the 30s. His main complaint is due to fatigue.He feels fatigued most the time both when at rest and with exertion. He is able to do most of his daily activities, but he says he wants to sleep most of the time. He is a Training and development officer at [employer].  Today, he denies symptoms of palpitations, chest pain, shortness of breath, orthopnea, PND, lower extremity edema, claudication, dizziness, presyncope, syncope, bleeding, or neurologic sequela. The patient is tolerating medications without difficulties.    Past Medical History:  Diagnosis Date  . Anemia   . Bradycardia   . GERD (gastroesophageal reflux disease)   . H/O: GI bleed   . Herniated disc, cervical   . Hiatal hernia   . Hyperlipidemia   . Metacarpal bone fracture   . Rosacea, acne    Past Surgical History:  Procedure Laterality Date  . CARDIAC CATHETERIZATION N/A 06/20/2015   Procedure: Left Heart Cath and Coronary Angiography;  Surgeon: Troy Sine, MD;  Location: [place] [facility];  Service: Cardiovascular;  Laterality: N/A;  . CERVICAL FUSION    . COLONOSCOPY WITH  PROPOFOL N/A 06/13/2015   Procedure: COLONOSCOPY WITH PROPOFOL;  Surgeon: Arta Silence, MD;  Location: [facility];  Service: Endoscopy;  Laterality: N/A;  . ESOPHAGOGASTRODUODENOSCOPY (EGD) WITH PROPOFOL N/A 06/13/2015   Procedure: ESOPHAGOGASTRODUODENOSCOPY (EGD) WITH PROPOFOL;  Surgeon: Arta Silence, MD;  Location: [facility];  Service: Endoscopy;  Laterality: N/A;  . HERNIA REPAIR    . MYOTOMY    . NISSEN FUNDOPLICATION  6073  . VARICOCELE EXCISION Left      Current Outpatient Prescriptions  Medication Sig Dispense Refill  . azelastine (ASTELIN) 0.1 % nasal spray Place 1 spray into both nostrils as needed for rhinitis. Use in each nostril as directed    . Iron-FA-B Cmp-C-Biot-Probiotic (FUSION PLUS) CAPS As Needed  1  . omeprazole (PRILOSEC) 40 MG capsule Take 40 mg by mouth daily.     No current facility-administered medications for this visit.     Allergies:   Patient has no known allergies.   Social History:  The patient  reports that he has never smoked. He has never used smokeless tobacco.   Family History:  The patient's family history includes Breast cancer in his mother; Diabetes in his father and paternal grandmother; Heart disease in his father.    ROS:  Please see the history of present illness.   Otherwise, review of systems is positive for fatigue.   All other systems are reviewed and negative.    PHYSICAL EXAM: VS:  Ht 5\' 8"  (1.727 m)   Wt 140 lb 3.2 oz (  63.6 kg)   BMI 21.32 kg/m  , BMI Body mass index is 21.32 kg/m. GEN: Well nourished, well developed, in no acute distress  HEENT: normal  Neck: no JVD, carotid bruits, or masses Cardiac: RRR; no murmurs, rubs, or gallops,no edema  Respiratory:  clear to auscultation bilaterally, normal work of breathing GI: soft, nontender, nondistended, + BS MS: no deformity or atrophy  Skin: warm and dry Neuro:  Strength and sensation are intact Psych: euthymic mood, full affect  EKG:  EKG is ordered  today. Personal review of the ekg ordered shows Ectopic atrial rhythm, heart rate 37, multiple APCs  Recent Labs: No results found for requested labs within last 8760 hours.    Lipid Panel     Component Value Date/Time   CHOL  12/06/2010 0620    174        ATP III CLASSIFICATION:  <200     mg/dL   Desirable  200-239  mg/dL   Borderline High  >=240    mg/dL   High          TRIG 109 12/06/2010 0620   HDL 59 12/06/2010 0620   CHOLHDL 2.9 12/06/2010 0620   VLDL 22 12/06/2010 0620   LDLCALC  12/06/2010 0620    93        Total Cholesterol/HDL:CHD Risk Coronary Heart Disease Risk Table                     Men   Women  1/2 Average Risk   3.4   3.3  Average Risk       5.0   4.4  2 X Average Risk   9.6   7.1  3 X Average Risk  23.4   11.0        Use the calculated Patient Ratio above and the CHD Risk Table to determine the patient's CHD Risk.        ATP III CLASSIFICATION (LDL):  <100     mg/dL   Optimal  100-129  mg/dL   Near or Above                    Optimal  130-159  mg/dL   Borderline  160-189  mg/dL   High  >190     mg/dL   Very High     Wt Readings from Last 3 Encounters:  03/18/17 140 lb 3.2 oz (63.6 kg)  02/18/17 142 lb 3.2 oz (64.5 kg)  06/20/15 142 lb (64.4 kg)      Other studies Reviewed: Additional studies/ records that were reviewed today include: TTE 03/11/17, Cath 2016  Review of the above records today demonstrates:  - Left ventricle: The cavity size was moderately dilated. Systolic   function was normal. Wall motion was normal; there were no   regional wall motion abnormalities. Features are consistent with   a pseudonormal left ventricular filling pattern, with concomitant   abnormal relaxation and increased filling pressure (grade 2   diastolic dysfunction). - Aortic valve: Trileaflet; mildly thickened, mildly calcified   leaflets. - Mitral valve: There was mild regurgitation. - Right ventricle: The cavity size was mildly dilated. Wall    thickness was normal. - Right atrium: The atrium was mildly dilated. - Tricuspid valve: There was mild regurgitation directed   eccentrically. - Pulmonary arteries: PA peak pressure: 42 mm Hg (S). - Impressions: The right ventricular systolic pressure was   increased consistent with moderate pulmonary hypertension.  The left ventricular systolic function is normal.   Normal LV function with an ejection fraction of 50-55%.  No evidence for coronary obstructive disease with a normal LAD with evidence for a mid LAD intramyocardial segment without systolic bridging, a normal circumflex and normal dominant RCA.  ASSESSMENT AND PLAN:  1.  PVCs: PVCs were found on his exercise treadmill test. Appeared to be coming from the outflow tracts. At this time, Jovaun Levene monitor for worsening of his PVCs. EKG today, and EKG does primary physician's office showed no evidence of PVCs.  2. Fatigue: It was found to have sinus bradycardia during waking hours. Likely due to sinus node dysfunction. It is possible that pacemaker placement would improve his symptoms. Risks and benefits of pacemaker placement were discussed. Risks include bleeding, tamponade, heart block, and infection. The patient understands these risks and has agreed to the procedure.    Current medicines are reviewed at length with the patient today.   The patient does not have concerns regarding his medicines.  The following changes were made today:  none  Labs/ tests ordered today include:  No orders of the defined types were placed in this encounter.    Disposition:   FU with Avia Merkley 3 months  Signed, Lucilia Yanni Meredith Leeds, MD  03/18/2017 10:16 AM     [facility] [address] [place] [address] [phone] (office) [phone] (fax)

## 2017-04-08 ENCOUNTER — Other Ambulatory Visit: Payer: Managed Care, Other (non HMO) | Admitting: *Deleted

## 2017-04-08 ENCOUNTER — Telehealth: Payer: Self-pay | Admitting: Cardiology

## 2017-04-08 DIAGNOSIS — I470 Re-entry ventricular arrhythmia: Secondary | ICD-10-CM

## 2017-04-08 DIAGNOSIS — R011 Cardiac murmur, unspecified: Secondary | ICD-10-CM

## 2017-04-08 DIAGNOSIS — R9430 Abnormal result of cardiovascular function study, unspecified: Secondary | ICD-10-CM

## 2017-04-08 DIAGNOSIS — F329 Major depressive disorder, single episode, unspecified: Secondary | ICD-10-CM

## 2017-04-08 DIAGNOSIS — R5383 Other fatigue: Secondary | ICD-10-CM

## 2017-04-08 LAB — BASIC METABOLIC PANEL
BUN/Creatinine Ratio: 24 (ref 10–24)
BUN: 22 mg/dL (ref 8–27)
CO2: 25 mmol/L (ref 18–29)
CREATININE: 0.93 mg/dL (ref 0.76–1.27)
Calcium: 10 mg/dL (ref 8.6–10.2)
Chloride: 102 mmol/L (ref 96–106)
GFR calc Af Amer: 96 mL/min/{1.73_m2} (ref >59)
GFR calc non Af Amer: 83 mL/min/{1.73_m2} (ref >59)
Glucose: 85 mg/dL (ref 65–99)
Potassium: 4.9 mmol/L (ref 3.5–5.2)
SODIUM: 142 mmol/L (ref 134–144)

## 2017-04-08 LAB — PROTIME-INR
INR: 1 (ref 0.8–1.2)
PROTHROMBIN TIME: 10.6 s (ref 9.1–12.0)

## 2017-04-08 LAB — CBC WITH DIFFERENTIAL/PLATELET
BASOS: 0 %
Basophils Absolute: 0 10*3/uL (ref 0.0–0.2)
EOS (ABSOLUTE): 0.1 10*3/uL (ref 0.0–0.4)
EOS: 2 %
HEMATOCRIT: 40.3 % (ref 37.5–51.0)
Hemoglobin: 14.4 g/dL (ref 13.0–17.7)
Immature Grans (Abs): 0 10*3/uL (ref 0.0–0.1)
Immature Granulocytes: 0 %
Lymphocytes Absolute: 1.4 10*3/uL (ref 0.7–3.1)
Lymphs: 19 %
MCH: 32.2 pg (ref 26.6–33.0)
MCHC: 35.7 g/dL (ref 31.5–35.7)
MCV: 90 fL (ref 79–97)
MONOS ABS: 1 10*3/uL — AB (ref 0.1–0.9)
Monocytes: 13 %
Neutrophils Absolute: 4.7 10*3/uL (ref 1.4–7.0)
Neutrophils: 66 %
Platelets: 242 10*3/uL (ref 150–379)
RBC: 4.47 x10E6/uL (ref 4.14–5.80)
RDW: 12.9 % (ref 12.3–15.4)
WBC: 7.2 10*3/uL (ref 3.4–10.8)

## 2017-04-08 NOTE — Addendum Note (Signed)
Addended by: Eulis Foster on: 04/08/2017 11:02 AM   Modules accepted: Orders

## 2017-04-08 NOTE — Telephone Encounter (Signed)
Walk in pt Form- Pt has questions about next weeks procedure.please call and discuss. Placed in [employer].

## 2017-04-09 NOTE — Telephone Encounter (Signed)
Pt inquiring as to d/c time after PPM implant next week.  Informed pt that it can vary depending when order for d/c is placed and nurse being able to get discharging patient's.  Advised pt to discuss w/ nurse when he gets to his floor post implant and they can help better given him an estimate.  Apologized for not being able to give him a more definitive answer.  Pt understands and thanks me for calling him.

## 2017-04-12 ENCOUNTER — Telehealth: Payer: Self-pay | Admitting: Cardiology

## 2017-04-12 NOTE — Telephone Encounter (Signed)
Lab results given to pt.

## 2017-04-12 NOTE — Telephone Encounter (Signed)
°  Follow Up ° °Returning call from yesterday. Please call. °

## 2017-04-16 ENCOUNTER — Ambulatory Visit (HOSPITAL_COMMUNITY)
Admission: RE | Admit: 2017-04-16 | Discharge: 2017-04-18 | Disposition: A | Discharge status: Home / Self Care | Payer: Managed Care, Other (non HMO) | Source: Ambulatory Visit | Attending: Cardiology | Admitting: Cardiology

## 2017-04-16 ENCOUNTER — Encounter (HOSPITAL_COMMUNITY)
Admission: RE | Disposition: A | Discharge status: Home / Self Care | Payer: Self-pay | Source: Ambulatory Visit | Attending: Cardiology

## 2017-04-16 ENCOUNTER — Encounter (HOSPITAL_COMMUNITY): Payer: Self-pay | Admitting: Cardiology

## 2017-04-16 DIAGNOSIS — E785 Hyperlipidemia, unspecified: Secondary | ICD-10-CM | POA: Insufficient documentation

## 2017-04-16 DIAGNOSIS — J95811 Postprocedural pneumothorax: Secondary | ICD-10-CM | POA: Diagnosis not present

## 2017-04-16 DIAGNOSIS — J939 Pneumothorax, unspecified: Secondary | ICD-10-CM

## 2017-04-16 DIAGNOSIS — I495 Sick sinus syndrome: Secondary | ICD-10-CM | POA: Diagnosis not present

## 2017-04-16 DIAGNOSIS — K449 Diaphragmatic hernia without obstruction or gangrene: Secondary | ICD-10-CM | POA: Insufficient documentation

## 2017-04-16 DIAGNOSIS — Z95818 Presence of other cardiac implants and grafts: Secondary | ICD-10-CM

## 2017-04-16 DIAGNOSIS — I493 Ventricular premature depolarization: Secondary | ICD-10-CM | POA: Insufficient documentation

## 2017-04-16 DIAGNOSIS — Y838 Other surgical procedures as the cause of abnormal reaction of the patient, or of later complication, without mention of misadventure at the time of the procedure: Secondary | ICD-10-CM | POA: Insufficient documentation

## 2017-04-16 DIAGNOSIS — Z950 Presence of cardiac pacemaker: Secondary | ICD-10-CM

## 2017-04-16 HISTORY — PX: PACEMAKER IMPLANT: EP1218

## 2017-04-16 HISTORY — DX: Presence of cardiac pacemaker: Z95.0

## 2017-04-16 HISTORY — DX: Sick sinus syndrome: I49.5

## 2017-04-16 LAB — SURGICAL PCR SCREEN
MRSA, PCR: NEGATIVE
Staphylococcus aureus: POSITIVE — AB

## 2017-04-16 SURGERY — PACEMAKER IMPLANT

## 2017-04-16 MED ORDER — PANTOPRAZOLE SODIUM 40 MG PO TBEC
40.0000 mg | DELAYED_RELEASE_TABLET | Freq: Every day | ORAL | Status: DC
  Filled 2017-04-16: qty 1

## 2017-04-16 MED ORDER — HEPARIN (PORCINE) IN NACL 2-0.9 UNIT/ML-% IJ SOLN
INTRAMUSCULAR | Status: AC | PRN
  Administered 2017-04-16: 500 mL

## 2017-04-16 MED ORDER — CEFAZOLIN SODIUM-DEXTROSE 1-4 GM/50ML-% IV SOLN
1.0000 g | Freq: Four times a day (QID) | INTRAVENOUS | Status: AC
  Administered 2017-04-16 – 2017-04-17 (×3): 1 g via INTRAVENOUS
  Filled 2017-04-16 (×3): qty 50

## 2017-04-16 MED ORDER — CEFAZOLIN SODIUM-DEXTROSE 2-4 GM/100ML-% IV SOLN
2.0000 g | INTRAVENOUS | Status: AC
  Administered 2017-04-16: 2 g via INTRAVENOUS
  Filled 2017-04-16: qty 100

## 2017-04-16 MED ORDER — HEPARIN (PORCINE) IN NACL 2-0.9 UNIT/ML-% IJ SOLN
INTRAMUSCULAR | Status: AC
  Filled 2017-04-16: qty 500

## 2017-04-16 MED ORDER — VITAMIN C 500 MG PO TABS
1000.0000 mg | ORAL_TABLET | Freq: Every day | ORAL | Status: DC
  Filled 2017-04-16 (×2): qty 2

## 2017-04-16 MED ORDER — MIDAZOLAM HCL 5 MG/5ML IJ SOLN
INTRAMUSCULAR | Status: AC
Start: 2017-04-16 — End: 2017-04-16
  Filled 2017-04-16: qty 5

## 2017-04-16 MED ORDER — FENTANYL CITRATE (PF) 100 MCG/2ML IJ SOLN
INTRAMUSCULAR | Status: DC | PRN
  Administered 2017-04-16 (×3): 25 ug via INTRAVENOUS

## 2017-04-16 MED ORDER — ONDANSETRON HCL 4 MG/2ML IJ SOLN: 4.0000 mg | Freq: Four times a day (QID) | INTRAMUSCULAR | Status: DC | PRN

## 2017-04-16 MED ORDER — SODIUM CHLORIDE 0.9 % IV SOLN
INTRAVENOUS | Status: DC
  Administered 2017-04-16: 06:00:00 via INTRAVENOUS

## 2017-04-16 MED ORDER — ACETAMINOPHEN 325 MG PO TABS
325.0000 mg | ORAL_TABLET | ORAL | Status: DC | PRN
  Administered 2017-04-16: 650 mg via ORAL
  Filled 2017-04-16: qty 2

## 2017-04-16 MED ORDER — FENTANYL CITRATE (PF) 100 MCG/2ML IJ SOLN
INTRAMUSCULAR | Status: AC
  Filled 2017-04-16: qty 2

## 2017-04-16 MED ORDER — IOPAMIDOL (ISOVUE-370) INJECTION 76%
INTRAVENOUS | Status: DC | PRN
  Administered 2017-04-16: 15 mL via INTRAVENOUS

## 2017-04-16 MED ORDER — SODIUM CHLORIDE 0.9 % IR SOLN
80.0000 mg | Status: AC
  Administered 2017-04-16: 80 mg
  Filled 2017-04-16: qty 2

## 2017-04-16 MED ORDER — SODIUM CHLORIDE 0.9 % IR SOLN
Status: AC
  Filled 2017-04-16: qty 2

## 2017-04-16 MED ORDER — MUPIROCIN 2 % EX OINT
1.0000 "application " | TOPICAL_OINTMENT | Freq: Once | CUTANEOUS | Status: AC
  Administered 2017-04-16: 1 via TOPICAL
  Filled 2017-04-16: qty 22

## 2017-04-16 MED ORDER — AZELASTINE HCL 0.1 % NA SOLN
1.0000 | NASAL | Status: DC | PRN
  Filled 2017-04-16: qty 30

## 2017-04-16 MED ORDER — TRAMADOL HCL 50 MG PO TABS
50.0000 mg | ORAL_TABLET | Freq: Four times a day (QID) | ORAL | Status: DC | PRN
  Administered 2017-04-16: 50 mg via ORAL
  Filled 2017-04-16: qty 1

## 2017-04-16 MED ORDER — LIDOCAINE HCL (PF) 1 % IJ SOLN
INTRAMUSCULAR | Status: DC | PRN
  Administered 2017-04-16: 45 mL

## 2017-04-16 MED ORDER — MUPIROCIN 2 % EX OINT
TOPICAL_OINTMENT | CUTANEOUS | Status: AC
  Administered 2017-04-16: 1 via TOPICAL
  Filled 2017-04-16: qty 22

## 2017-04-16 MED ORDER — LIDOCAINE HCL (PF) 1 % IJ SOLN
INTRAMUSCULAR | Status: AC
  Filled 2017-04-16: qty 60

## 2017-04-16 MED ORDER — MIDAZOLAM HCL 5 MG/5ML IJ SOLN
INTRAMUSCULAR | Status: DC | PRN
  Administered 2017-04-16 (×3): 1 mg via INTRAVENOUS

## 2017-04-16 MED ORDER — IOPAMIDOL (ISOVUE-370) INJECTION 76%
INTRAVENOUS | Status: AC
  Filled 2017-04-16: qty 50

## 2017-04-16 MED ORDER — TAB-A-VITE/IRON PO TABS
1.0000 | ORAL_TABLET | Freq: Every day | ORAL | Status: DC
  Filled 2017-04-16 (×2): qty 1

## 2017-04-16 MED ORDER — CEFAZOLIN SODIUM-DEXTROSE 2-4 GM/100ML-% IV SOLN
INTRAVENOUS | Status: AC
  Filled 2017-04-16: qty 100

## 2017-04-16 SURGICAL SUPPLY — 7 items
CABLE SURGICAL S-101-97-12 (CABLE) ×3 IMPLANT
LEAD TENDRIL MRI 52CM LPA1200M (Lead) ×3 IMPLANT
LEAD TENDRIL MRI 58CM LPA1200M (Lead) ×3 IMPLANT
PACEMAKER ASSURITY DR-RF (Pacemaker) ×3 IMPLANT
PAD DEFIB LIFELINK (PAD) ×3 IMPLANT
SHEATH CLASSIC 8F (SHEATH) ×6 IMPLANT
TRAY PACEMAKER INSERTION (PACKS) ×3 IMPLANT

## 2017-04-16 NOTE — Discharge Instructions (Signed)
° ° °  Supplemental Discharge Instructions for  Pacemaker/Defibrillator Patients  Activity No heavy lifting or vigorous activity with your left/right arm for 6 to 8 weeks.  Do not raise your left/right arm above your head for one week.  Gradually raise your affected arm as drawn below.           __       04/20/17                           04/21/17                  04/22/17            04/23/17  NO DRIVING for 1 week   ; you may begin driving on   08/19/25  .  WOUND CARE - Keep the wound area clean and dry.  Do not get this area wet for one week. No showers for one week; you may shower on  04/23/17   . - The tape/steri-strips on your wound will fall off; do not pull them off.  No bandage is needed on the site.  DO  NOT apply any creams, oils, or ointments to the wound area. - If you notice any drainage or discharge from the wound, any swelling or bruising at the site, or you develop a fever > 101? F after you are discharged home, call the office at once.  Special Instructions - You are still able to use cellular telephones; use the ear opposite the side where you have your pacemaker/defibrillator.  Avoid carrying your cellular phone near your device. - When traveling through airports, show security personnel your identification card to avoid being screened in the metal detectors.  Ask the security personnel to use the hand wand. - Avoid arc welding equipment, MRI testing (magnetic resonance imaging), TENS units (transcutaneous nerve stimulators).  Call the office for questions about other devices. - Avoid electrical appliances that are in poor condition or are not properly grounded. - Microwave ovens are safe to be near or to operate.

## 2017-04-16 NOTE — H&P (Signed)
Johnathan [name] is a 71 y.o. male who presents to the hospital with sinus node dysfunction. He has complaints of fatigue. Presenting for pacemaker implant. On exam, bradycardic, no murmurs, lungs clear. Risks and benefits of pacemaker implant were discussed. Risks include but not limited to bleeding, infection, tamponade, pneumothorax. He understands the risks and has agreed to the procedure.  [name], MD 04/16/2017 7:06 AM

## 2017-04-16 NOTE — Discharge Summary (Signed)
ELECTROPHYSIOLOGY PROCEDURE DISCHARGE SUMMARY    Patient ID: Johnathan [name],  MRN: [id number], DOB/AGE: Nov 08, 1946 71 y.o.  Admit date: 04/16/2017 Discharge date: 04/18/2017  Primary Care Physician: Antony Contras, MD Primary Cardiologist: Johnathan [name] Electrophysiologist: Johnathan [name]  Primary Discharge Diagnosis:  Symptomatic bradycardia status post pacemaker implantation this admission  Secondary Discharge Diagnosis:  1.  PVC's 2.  Hyperlipidemia    No Known Allergies   Procedures This Admission:  1.  Implantation of a STJ dual chamber PPM on 04/16/17 by Dr Johnathan [name]. See op note for full details. There were no immediate post procedure complications. 2.  CXR on 04/17/17 demonstrated 15% pneumothorax status post device implantation.  3.  CXR on 04/18/17 with pneumothorax resolved  Brief HPI: Johnathan [name] is a 71 y.o. male was referred to electrophysiology in the outpatient setting for consideration of PPM implantation.   The patient has had symptomatic bradycardia without reversible causes identified.  Risks, benefits, and alternatives to PPM implantation were reviewed with the patient who wished to proceed.   Hospital Course:  The patient was admitted and underwent implantation of a STJ dual chamber PPM with details as outlined above.  He  was monitored on telemetry overnight which demonstrated atrial pacing with intrinsic ventricular conduction.  Left chest was without hematoma or ecchymosis.  The device was interrogated and found to be functioning normally.  CXR was obtained and demonstrated 15% ptx post device implant. He was treated with high flow O2 and ptx had resolved morning of 04/18/17.  Wound care, arm mobility, and restrictions were reviewed with the patient.  The patient was examined by Dr Johnathan [name] and considered stable for discharge to home.    Physical Exam: Vitals:   04/17/17 0459 04/17/17 1523 04/17/17 1951 04/18/17 0444  BP: 111/71 108/74 107/74 135/79  Pulse:  61 60 (!) 59 60  Resp: 18 18 18 18   Temp: 97.6 F (36.4 C)  97.6 F (36.4 C) 97.4 F (36.3 C)  TempSrc: Oral  Oral Axillary  SpO2: 98% 100% 100% 100%  Weight:      Height:        GEN- The patient is well appearing, alert and oriented x 3 today.   HEENT: normocephalic, atraumatic; sclera clear, conjunctiva pink; hearing intact; oropharynx clear; neck supple  Lungs- Clear to ausculation bilaterally, normal work of breathing.  No wheezes, rales, rhonchi Heart- Regular rate and rhythm, no murmurs, rubs or gallops  GI- soft, non-tender, non-distended, bowel sounds present  Extremities- no clubbing, cyanosis, or edema  MS- no significant deformity or atrophy Skin- warm and dry, no rash or lesion, left chest without hematoma/ecchymosis Psych- euthymic mood, full affect Neuro- strength and sensation are intact   Labs:   Lab Results  Component Value Date   WBC 7.2 04/08/2017   HGB 12.2 (L) 06/17/2015   HCT 40.3 04/08/2017   MCV 90 04/08/2017   PLT 242 04/08/2017   No results for input(s): NA, K, CL, CO2, BUN, CREATININE, CALCIUM, PROT, BILITOT, ALKPHOS, ALT, AST, GLUCOSE in the last 168 hours.  Invalid input(s): LABALBU  Discharge Medications:  Allergies as of 04/18/2017   No Known Allergies     Medication List    TAKE these medications   azelastine 0.1 % nasal spray Commonly known as:  ASTELIN Place 1 spray into both nostrils as needed for rhinitis. Use in each nostril as directed   FUSION PLUS Caps Take 1 tablet by mouth daily. As Needed   omeprazole 40 MG capsule  Commonly known as:  PRILOSEC Take 40 mg by mouth daily.   vitamin C 1000 MG tablet Take 1,000 mg by mouth daily.       Disposition:  Discharge Instructions    Diet - low sodium heart healthy    Complete by:  As directed    Increase activity slowly    Complete by:  As directed      Follow-up Information    [facility] Follow up on 05/01/2017.   Specialty:  Cardiology Why:  at  [facility] for wound check  Contact information: [address] [place] [phone]       Constance Haw, MD Follow up on 06/18/2017.   Specialty:  Cardiology Why:  at 9:30AM Contact information: [place] [address] [phone]           Duration of Discharge Encounter: Greater than 30 minutes including physician time.  Signed, Chanetta Marshall, NP 04/18/2017 7:18 AM  I have seen and examined this patient with Chanetta Marshall.  Agree with above, note added to reflect my findings.  On exam, RRR, no murmurs, lungs clear. Had dual chamber pacemaker placed for sinus node dysfunction. Interrogation without issues. CXR showed evidence of pneumothorax. Patient put on high flow oxygen. CXR today shows resolution of pneumothorax. Plan for discharge today with follow up in device clinic.    Abdinasir Spadafore M. Chasitee Zenker MD 04/18/2017 7:37 AM

## 2017-04-17 ENCOUNTER — Ambulatory Visit (HOSPITAL_COMMUNITY): Payer: Managed Care, Other (non HMO)

## 2017-04-17 ENCOUNTER — Encounter (HOSPITAL_COMMUNITY): Payer: Self-pay | Admitting: General Practice

## 2017-04-17 DIAGNOSIS — I495 Sick sinus syndrome: Secondary | ICD-10-CM | POA: Diagnosis not present

## 2017-04-17 MED ORDER — SENNOSIDES-DOCUSATE SODIUM 8.6-50 MG PO TABS
1.0000 | ORAL_TABLET | Freq: Once | ORAL | Status: AC
  Administered 2017-04-17: 1 via ORAL
  Filled 2017-04-17: qty 1

## 2017-04-17 NOTE — Progress Notes (Signed)
    SUBJECTIVE: The patient is doing well today.  At this time, he denies chest pain, shortness of breath, or any new concerns.  . multivitamins with iron  1 tablet Oral Daily  . pantoprazole  40 mg Oral Daily  . vitamin C  1,000 mg Oral Daily     OBJECTIVE: Physical Exam: Vitals:   04/16/17 1300 04/16/17 1348 04/16/17 2038 04/17/17 0459  BP: 107/68 116/72 110/61 111/71  Pulse: (!) 59 (!) 56 60 61  Resp:  18 18 18   Temp: 98 F (36.7 C) 97.6 F (36.4 C) 97.7 F (36.5 C) 97.6 F (36.4 C)  TempSrc:  Oral Oral Oral  SpO2:  99% 99% 98%  Weight:      Height:        Intake/Output Summary (Last 24 hours) at 04/17/17 6283 Last data filed at 04/16/17 1800  Gross per 24 hour  Intake              480 ml  Output                0 ml  Net              480 ml    Telemetry reveals atrial pacing with intrinsic ventricular conduction (personally reviewed)  GEN- The patient is well appearing, alert and oriented x 3 today.   Head- normocephalic, atraumatic Eyes-  Sclera clear, conjunctiva pink Ears- hearing intact Oropharynx- clear Neck- supple  Lungs- Clear to ausculation bilaterally, normal work of breathing Heart- Regular rate and rhythm  GI- soft, NT, ND, + BS Extremities- no clubbing, cyanosis, or edema Skin- no rash or lesion Psych- euthymic mood, full affect Neuro- strength and sensation are intact   RADIOLOGY: Dg Chest 2 View  Result Date: 04/17/2017 CLINICAL DATA:  Post pacemaker placement EXAM: CHEST  2 VIEW COMPARISON:  11-2007 FINDINGS: New LEFT subclavian sequential pacemaker with leads projecting at RIGHT atrium and RIGHT ventricle. Normal heart size and pulmonary vascularity. Moderate-sized hiatal hernia. LEFT pneumothorax identified, estimated at 15%. No mediastinal shift. Eventration LEFT diaphragm. Lungs otherwise clear. Prior cervical spine fusion. Osseous demineralization. IMPRESSION: Small LEFT pneumothorax estimated at 15% post pacemaker placement.  Moderate-sized hiatal hernia. Findings called to [facility] on [facility] on 04/17/2017 at 0756 hrs. Electronically Signed   By: Lavonia Dana M.D.   On: 04/17/2017 07:58    ASSESSMENT AND PLAN:  Active Problems:   Sick sinus syndrome ([place])  1.  Sinus node dysfunction S/p PPM Normal device interrogation CXR with 15% ptx - Unknown Schleyer place high flow O2 and repeat CXR this afternoon  Chanetta Marshall, NP 04/17/2017 8:13 AM  I have seen and examined this patient with Chanetta Marshall.  Agree with above, note added to reflect my findings.  On exam, RRR, no murmurs, lungs clear. Had St. Jude dual-chamber pacemaker placed yesterday. Device implant complicated by pneumothorax. Plan for high flow oxygen with repeat chest x-ray this afternoon. Should his x-ray showed no evidence of pneumothorax, [place] Mehra plan for discharge.    Adisyn Ruscitti M. Lachlan Pelto MD 04/17/2017 9:00 AM

## 2017-04-18 ENCOUNTER — Ambulatory Visit (HOSPITAL_COMMUNITY): Payer: Managed Care, Other (non HMO)

## 2017-04-18 DIAGNOSIS — I495 Sick sinus syndrome: Secondary | ICD-10-CM | POA: Diagnosis not present

## 2017-04-18 NOTE — Progress Notes (Signed)
Ed Blalock Hollopeter to be D/C'd Home per MD order. Discussed with the patient and all questions fully answered.    VVS, Skin clean, dry and intact without evidence of skin break down, no evidence of skin tears noted.  IV catheter discontinued intact. Site without signs and symptoms of complications. Dressing and pressure applied.  An After Visit Summary was printed and given to the patient.  Patient escorted via [place], and D/C home via private auto.  Cyndra Numbers  04/18/2017 8:14 AM

## 2017-05-01 ENCOUNTER — Ambulatory Visit (INDEPENDENT_AMBULATORY_CARE_PROVIDER_SITE_OTHER): Payer: Managed Care, Other (non HMO) | Admitting: *Deleted

## 2017-05-01 DIAGNOSIS — R001 Bradycardia, unspecified: Secondary | ICD-10-CM

## 2017-05-01 DIAGNOSIS — I495 Sick sinus syndrome: Secondary | ICD-10-CM

## 2017-05-01 LAB — CUP PACEART INCLINIC DEVICE CHECK
Brady Statistic RA Percent Paced: 90 %
Brady Statistic RV Percent Paced: 2.1 %
Implantable Lead Implant Date: 20180522
Implantable Lead Location: 753859
Lead Channel Impedance Value: 480 Ohm
Lead Channel Pacing Threshold Amplitude: 0.75 V
Lead Channel Pacing Threshold Pulse Width: 0.5 ms
Lead Channel Sensing Intrinsic Amplitude: 2.9 mV
Lead Channel Setting Pacing Amplitude: 3.5 V
Lead Channel Setting Pacing Pulse Width: 0.5 ms
MDC IDC LEAD IMPLANT DT: 20180522
MDC IDC LEAD LOCATION: 753860
MDC IDC MSMT LEADCHNL RV IMPEDANCE VALUE: 660 Ohm
MDC IDC MSMT LEADCHNL RV PACING THRESHOLD AMPLITUDE: 0.5 V
MDC IDC MSMT LEADCHNL RV PACING THRESHOLD PULSEWIDTH: 0.5 ms
MDC IDC MSMT LEADCHNL RV SENSING INTR AMPL: 9.2 mV
MDC IDC PG IMPLANT DT: 20180522
MDC IDC SESS DTM: 20180606104143
MDC IDC SET LEADCHNL RA PACING AMPLITUDE: 3.5 V
MDC IDC SET LEADCHNL RV SENSING SENSITIVITY: 2 mV
Pulse Gen Model: 2272
Pulse Gen Serial Number: 8908368

## 2017-05-01 NOTE — Progress Notes (Signed)
Wound check appointment. Steri-strips removed. Wound without redness or edema. Incision edges approximated, wound well healed. Normal device function. Thresholds, sensing, and impedances consistent with implant measurements. Device programmed at 3.5V for extra safety margin until 3 month visit. Histogram distribution appropriate for patient and level of activity. Patient reports that he is having mild dizzy spells during walking, better than prior to PPM implant- RR slope increased from 10 to 12. 1 mode switch- 2 seconds, ST. No high ventricular rates noted. Patient educated about wound care, arm mobility, lifting restrictions. ROV with WC 06/18/17.

## 2017-06-18 ENCOUNTER — Encounter: Payer: Managed Care, Other (non HMO) | Admitting: Cardiology

## 2017-07-05 ENCOUNTER — Encounter: Payer: Self-pay | Admitting: *Deleted

## 2017-07-09 ENCOUNTER — Telehealth: Payer: Self-pay | Admitting: Cardiology

## 2017-07-09 NOTE — Telephone Encounter (Signed)
New message    Pt is calling stating she is having a problem with the coding department for the hospital. Please call.

## 2017-07-09 NOTE — Telephone Encounter (Signed)
Updating me to let me know he has left a message w/ billing supervisor at the hospital to discuss coding error. They are telling him that once d/c from hospital they cannot change code r/t to Medicare  (pt had PPM implant 04/16/17 and d/c on 5/24 d/t extra day for monitoring post procedure PNX.  Coding for hospital stay was Outpatient w/ extended recovery -- pt has Medicare, part A and it won't cover procedure under current code.  He is discussing w/ hospital billing)

## 2017-07-17 ENCOUNTER — Encounter: Payer: Managed Care, Other (non HMO) | Admitting: Cardiology

## 2017-07-23 ENCOUNTER — Ambulatory Visit (INDEPENDENT_AMBULATORY_CARE_PROVIDER_SITE_OTHER): Payer: Managed Care, Other (non HMO) | Admitting: Cardiology

## 2017-07-23 ENCOUNTER — Encounter: Payer: Self-pay | Admitting: Cardiology

## 2017-07-23 VITALS — BP 110/80 | HR 90 | Ht 68.0 in | Wt 137.6 lb

## 2017-07-23 DIAGNOSIS — Z950 Presence of cardiac pacemaker: Secondary | ICD-10-CM

## 2017-07-23 DIAGNOSIS — I480 Paroxysmal atrial fibrillation: Secondary | ICD-10-CM

## 2017-07-23 DIAGNOSIS — I495 Sick sinus syndrome: Secondary | ICD-10-CM | POA: Diagnosis not present

## 2017-07-23 LAB — CUP PACEART INCLINIC DEVICE CHECK
Battery Voltage: 2.99 V
Brady Statistic RA Percent Paced: 86 %
Date Time Interrogation Session: 20180828123433
Implantable Lead Implant Date: 20180522
Implantable Lead Implant Date: 20180522
Implantable Lead Location: 753860
Lead Channel Impedance Value: 512.5 Ohm
Lead Channel Impedance Value: 537.5 Ohm
Lead Channel Pacing Threshold Pulse Width: 0.5 ms
Lead Channel Sensing Intrinsic Amplitude: 1.4 mV
Lead Channel Sensing Intrinsic Amplitude: 11.9 mV
Lead Channel Setting Sensing Sensitivity: 2 mV
MDC IDC LEAD LOCATION: 753859
MDC IDC MSMT BATTERY REMAINING LONGEVITY: 110 mo
MDC IDC MSMT LEADCHNL RA PACING THRESHOLD AMPLITUDE: 0.75 V
MDC IDC MSMT LEADCHNL RV PACING THRESHOLD AMPLITUDE: 0.5 V
MDC IDC MSMT LEADCHNL RV PACING THRESHOLD PULSEWIDTH: 0.5 ms
MDC IDC PG IMPLANT DT: 20180522
MDC IDC SET LEADCHNL RA PACING AMPLITUDE: 2 V
MDC IDC SET LEADCHNL RV PACING AMPLITUDE: 2.5 V
MDC IDC SET LEADCHNL RV PACING PULSEWIDTH: 0.5 ms
MDC IDC STAT BRADY RV PERCENT PACED: 8.5 %
Pulse Gen Serial Number: 8908368

## 2017-07-23 NOTE — Patient Instructions (Signed)
Medication Instructions:  Your physician recommends that you continue on your current medications as directed. Please refer to the Current Medication list given to you today.  --- If you need a refill on your cardiac medications before your next appointment, please call your pharmacy. ---  Labwork: None ordered  Testing/Procedures: None ordered  Follow-Up: Remote monitoring is used to monitor your Pacemaker of ICD from home. This monitoring reduces the number of office visits required to check your device to one time per year. It allows [place] to keep an eye on the functioning of your device to ensure it is working properly. You are scheduled for a device check from home on 10/22/2017. You may send your transmission at any time that day. If you have a wireless device, the transmission will be sent automatically. After your physician reviews your transmission, you will receive a postcard with your next transmission date.  Your physician wants you to follow-up in: 9 months with Dr. Curt Bears.  You will receive a reminder letter in the mail two months in advance. If you don't receive a letter, please call our office to schedule the follow-up appointment.   Thank you for choosing [facility]!!   Trinidad Curet, RN [phone]

## 2017-07-23 NOTE — Progress Notes (Signed)
Electrophysiology Office Note   Date:  07/23/2017   ID:  Johnathan [name], DOB [birth year], MRN [id number]  PCP:  Johnathan Contras, MD  Cardiologist:  Johnathan [name] Primary Electrophysiologist:  Johnathan [name] Johnathan Leeds, MD    Chief Complaint  Patient presents with  . Pacemaker Check    Sinus node dysfunction/bradycardia     History of Present Illness: Johnathan [name] is a 71 y.o. male who is being seen today for the evaluation of sinus bradycardia, PVCs at the request of Johnathan Contras, MD. Presenting today for electrophysiology evaluation. He was evaluated with an exercise treadmill test and was found to have profound fatigue and decreased exercise tolerance. He had ST segment depression at peak exercise, but his cardiac catheterization was normal. He presented to cardiology clinic after an EKG on 3/19 performed by his PCP which showed sinus bradycardia with rates in the 30s. St. Jude dual chamber pacemaker implanted 0/53/97 complicated by pneumothorax.  Today, denies symptoms of palpitations, chest pain, shortness of breath, orthopnea, PND, lower extremity edema, claudication, dizziness, presyncope, syncope, bleeding, or neurologic sequela. The patient is tolerating medications without difficulties and is otherwise without complaint today.     Past Medical History:  Diagnosis Date  . Anemia   . Bradycardia   . GERD (gastroesophageal reflux disease)   . H/O: GI bleed   . Herniated disc, cervical   . Hiatal hernia   . Hyperlipidemia   . Metacarpal bone fracture   . Presence of permanent cardiac pacemaker 04/16/2017  . Rosacea, acne   . SSS (sick sinus syndrome) [facility])    Past Surgical History:  Procedure Laterality Date  . CARDIAC CATHETERIZATION N/A 06/20/2015   Procedure: Left Heart Cath and Coronary Angiography;  Surgeon: Troy Sine, MD;  Location: [place] [facility];  Service: Cardiovascular;  Laterality: N/A;  . CERVICAL FUSION    . COLONOSCOPY WITH PROPOFOL N/A 06/13/2015     Procedure: COLONOSCOPY WITH PROPOFOL;  Surgeon: Arta Silence, MD;  Location: [facility];  Service: Endoscopy;  Laterality: N/A;  . ESOPHAGOGASTRODUODENOSCOPY (EGD) WITH PROPOFOL N/A 06/13/2015   Procedure: ESOPHAGOGASTRODUODENOSCOPY (EGD) WITH PROPOFOL;  Surgeon: Arta Silence, MD;  Location: [facility];  Service: Endoscopy;  Laterality: N/A;  . HERNIA REPAIR    . INSERT / REPLACE / REMOVE PACEMAKER    . MYOTOMY    . NISSEN FUNDOPLICATION  6734  . PACEMAKER IMPLANT N/A 04/16/2017   Procedure: Pacemaker Implant;  Surgeon: Constance Haw, MD;  Location: [place] [facility];  Service: Cardiovascular;  Laterality: N/A;  . VARICOCELE EXCISION Left      Current Outpatient Prescriptions  Medication Sig Dispense Refill  . Ascorbic Acid (VITAMIN C) 1000 MG tablet Take 1,000 mg by mouth daily.    Marland Kitchen azelastine (ASTELIN) 0.1 % nasal spray Place 1 spray into both nostrils as needed for rhinitis. Use in each nostril as directed    . Iron-FA-B Cmp-C-Biot-Probiotic (FUSION PLUS) CAPS Take 1 tablet by mouth daily. As Needed  1  . omeprazole (PRILOSEC) 40 MG capsule Take 40 mg by mouth daily.     No current facility-administered medications for this visit.     Allergies:   Patient has no known allergies.   Social History:  The patient  reports that he has never smoked. He has never used smokeless tobacco. He reports that he does not drink alcohol or use drugs.   Family History:  The patient's family history includes Breast cancer in his mother; Diabetes in his father  and paternal grandmother; Heart disease in his father.    ROS:  Please see the history of present illness.   Otherwise, review of systems is positive for none.   All other systems are reviewed and negative.   PHYSICAL EXAM: VS:  BP 110/80   Pulse 90   Ht 5\' 8"  (1.727 m)   Wt 137 lb 9.6 oz (62.4 kg)   BMI 20.92 kg/m  , BMI Body mass index is 20.92 kg/m. GEN: Well nourished, well developed, in no acute distress  HEENT:  normal  Neck: no JVD, carotid bruits, or masses Cardiac: RRR; no murmurs, rubs, or gallops, no edema  Respiratory:  clear to auscultation bilaterally, normal work of breathing GI: soft, nontender, nondistended, + BS MS: no deformity or atrophy  Skin: warm and dry, device site well healed Neuro:  Strength and sensation are intact Psych: euthymic mood, full affect  EKG:  EKG is ordered today. Personal review of the ekg ordered shows A paced, nonspecific ST changes, low voltage  Personal review of the device interrogation today. Results in [place]: 04/08/2017: BUN 22; Creatinine, Ser 0.93; Hemoglobin 14.4; Platelets 242; Potassium 4.9; Sodium 142    Lipid Panel     Component Value Date/Time   CHOL  12/06/2010 0620    174        ATP III CLASSIFICATION:  <200     mg/dL   Desirable  200-239  mg/dL   Borderline High  >=240    mg/dL   High          TRIG 109 12/06/2010 0620   HDL 59 12/06/2010 0620   CHOLHDL 2.9 12/06/2010 0620   VLDL 22 12/06/2010 0620   LDLCALC  12/06/2010 0620    93        Total Cholesterol/HDL:CHD Risk Coronary Heart Disease Risk Table                     Men   Women  1/2 Average Risk   3.4   3.3  Average Risk       5.0   4.4  2 X Average Risk   9.6   7.1  3 X Average Risk  23.4   11.0        Use the calculated Patient Ratio above and the CHD Risk Table to determine the patient's CHD Risk.        ATP III CLASSIFICATION (LDL):  <100     mg/dL   Optimal  100-129  mg/dL   Near or Above                    Optimal  130-159  mg/dL   Borderline  160-189  mg/dL   High  >190     mg/dL   Very High     Wt Readings from Last 3 Encounters:  07/23/17 137 lb 9.6 oz (62.4 kg)  04/16/17 140 lb (63.5 kg)  03/18/17 140 lb 3.2 oz (63.6 kg)      Other studies Reviewed: Additional studies/ records that were reviewed today include: TTE 03/11/17, Cath 2016  Review of the above records today demonstrates:  - Left ventricle: The cavity size was  moderately dilated. Systolic   function was normal. Wall motion was normal; there were no   regional wall motion abnormalities. Features are consistent with   a pseudonormal left ventricular filling pattern, with concomitant   abnormal relaxation and increased filling pressure (grade 2  diastolic dysfunction). - Aortic valve: Trileaflet; mildly thickened, mildly calcified   leaflets. - Mitral valve: There was mild regurgitation. - Right ventricle: The cavity size was mildly dilated. Wall   thickness was normal. - Right atrium: The atrium was mildly dilated. - Tricuspid valve: There was mild regurgitation directed   eccentrically. - Pulmonary arteries: PA peak pressure: 42 mm Hg (S). - Impressions: The right ventricular systolic pressure was   increased consistent with moderate pulmonary hypertension.    The left ventricular systolic function is normal.   Normal LV function with an ejection fraction of 50-55%.  No evidence for coronary obstructive disease with a normal LAD with evidence for a mid LAD intramyocardial segment without systolic bridging, a normal circumflex and normal dominant RCA.  ASSESSMENT AND PLAN:  1.  PVCs: Doing well without major issue. PVCs are quite a bit less now that he has his pacemaker placed. No changes.  2. Sinus node dysfunction: S/p St. Jude dual chamber pacemaker implanted 04/16/17. Biceps functioning appropriately. No changes at this time.  3. Paroxysmal atrial fibrillation: Found on device interrogation. He is had 12 episodes since device implantation the longest of which lasted 6 minutes. We'll continue to monitor. Low risk for stroke. No into coagulation at this time.  This patients CHA2DS2-VASc Score and unadjusted Ischemic Stroke Rate (% per year) is equal to 0.6 % stroke rate/year from a score of 1  Above score calculated as 1 point each if present [CHF, HTN, DM, Vascular=MI/PAD/Aortic Plaque, Age if 65-74, or Male] Above score  calculated as 2 points each if present [Age > 75, or Stroke/TIA/TE]  Current medicines are reviewed at length with the patient today.   The patient does not have concerns regarding his medicines.  The following changes were made today:  None  Labs/ tests ordered today include:  No orders of the defined types were placed in this encounter.    Disposition:   FU with Shadoe Cryan 9 months  Signed, Wadell Craddock Johnathan Leeds, MD  07/23/2017 10:59 AM     [facility] [address] [place] [address] [phone] (office) [phone] (fax)

## 2017-10-22 ENCOUNTER — Encounter: Payer: Managed Care, Other (non HMO) | Admitting: *Deleted

## 2017-10-22 ENCOUNTER — Telehealth: Payer: Self-pay | Admitting: Cardiology

## 2017-10-22 NOTE — Telephone Encounter (Signed)
LMOVM reminding pt to send remote transmission.   

## 2017-10-24 ENCOUNTER — Encounter: Payer: Self-pay | Admitting: Cardiology

## 2017-10-28 ENCOUNTER — Encounter: Payer: Self-pay | Admitting: Cardiology

## 2017-10-28 ENCOUNTER — Ambulatory Visit (INDEPENDENT_AMBULATORY_CARE_PROVIDER_SITE_OTHER): Payer: Managed Care, Other (non HMO) | Admitting: *Deleted

## 2017-10-28 DIAGNOSIS — I495 Sick sinus syndrome: Secondary | ICD-10-CM

## 2017-10-28 NOTE — Progress Notes (Signed)
Remote pacemaker transmission.   

## 2017-10-29 LAB — CUP PACEART REMOTE DEVICE CHECK
Battery Voltage: 2.99 V
Brady Statistic AP VP Percent: 8.9 %
Brady Statistic AS VS Percent: 12 %
Brady Statistic RV Percent Paced: 8.9 %
Implantable Lead Implant Date: 20180522
Implantable Lead Location: 753859
Implantable Pulse Generator Implant Date: 20180522
Lead Channel Impedance Value: 550 Ohm
Lead Channel Pacing Threshold Amplitude: 0.5 V
Lead Channel Pacing Threshold Pulse Width: 0.5 ms
Lead Channel Sensing Intrinsic Amplitude: 12 mV
Lead Channel Setting Pacing Amplitude: 2 V
Lead Channel Setting Pacing Amplitude: 2.5 V
Lead Channel Setting Pacing Pulse Width: 0.5 ms
Lead Channel Setting Sensing Sensitivity: 2 mV
MDC IDC LEAD IMPLANT DT: 20180522
MDC IDC LEAD LOCATION: 753860
MDC IDC MSMT BATTERY REMAINING LONGEVITY: 106 mo
MDC IDC MSMT BATTERY REMAINING PERCENTAGE: 95.5 %
MDC IDC MSMT LEADCHNL RA IMPEDANCE VALUE: 530 Ohm
MDC IDC MSMT LEADCHNL RA PACING THRESHOLD AMPLITUDE: 0.75 V
MDC IDC MSMT LEADCHNL RA PACING THRESHOLD PULSEWIDTH: 0.5 ms
MDC IDC MSMT LEADCHNL RA SENSING INTR AMPL: 1.7 mV
MDC IDC PG SERIAL: 8908368
MDC IDC SESS DTM: 20181202060227
MDC IDC STAT BRADY AP VS PERCENT: 79 %
MDC IDC STAT BRADY AS VP PERCENT: 1 %
MDC IDC STAT BRADY RA PERCENT PACED: 88 %

## 2017-10-30 ENCOUNTER — Telehealth: Payer: Self-pay | Admitting: *Deleted

## 2017-10-30 NOTE — Telephone Encounter (Signed)
-----   Message from Johnathan Meredith Leeds, MD sent at 10/30/2017  9:59 AM EST ----- Abnormal device interrogation reviewed.  Lead parameters and battery status stable.  AF with rapid rates seen. Start dltiazem 120 mg daily

## 2017-10-30 NOTE — Telephone Encounter (Signed)
Notes recorded by Ranee Gosselin, RN on 10/30/2017 at 4:53 PM EST Phone call:  Johnathan [name] does not recall being symptomatic during the episodes of high ventricular rates on the transmission. He doesn't feel comfortable starting diltiazem. I have instructed him to call back if he experiences any SOB, palpitations, CP. I offered to schedule an appointment so that he could discuss this with Johnathan [name]- Johnathan [name] is in the middle of his busy season at work and it would be difficult to schedule an office visit.

## 2017-10-30 NOTE — Telephone Encounter (Signed)
LMOM to return call.

## 2017-11-25 DIAGNOSIS — J300 Vasomotor rhinitis: Secondary | ICD-10-CM | POA: Insufficient documentation

## 2018-01-27 ENCOUNTER — Ambulatory Visit (INDEPENDENT_AMBULATORY_CARE_PROVIDER_SITE_OTHER): Payer: Managed Care, Other (non HMO) | Admitting: *Deleted

## 2018-01-27 DIAGNOSIS — I495 Sick sinus syndrome: Secondary | ICD-10-CM | POA: Diagnosis not present

## 2018-01-28 ENCOUNTER — Telehealth: Payer: Self-pay | Admitting: *Deleted

## 2018-01-28 DIAGNOSIS — H903 Sensorineural hearing loss, bilateral: Secondary | ICD-10-CM | POA: Insufficient documentation

## 2018-01-28 LAB — CUP PACEART REMOTE DEVICE CHECK
Battery Remaining Longevity: 102 mo
Brady Statistic AP VP Percent: 8.8 %
Brady Statistic AP VS Percent: 79 %
Brady Statistic AS VP Percent: 1 %
Brady Statistic AS VS Percent: 12 %
Brady Statistic RA Percent Paced: 87 %
Implantable Lead Location: 753860
Implantable Pulse Generator Implant Date: 20180522
Lead Channel Impedance Value: 480 Ohm
Lead Channel Impedance Value: 540 Ohm
Lead Channel Pacing Threshold Amplitude: 0.5 V
Lead Channel Pacing Threshold Amplitude: 0.75 V
Lead Channel Pacing Threshold Pulse Width: 0.5 ms
Lead Channel Pacing Threshold Pulse Width: 0.5 ms
Lead Channel Sensing Intrinsic Amplitude: 11 mV
Lead Channel Setting Pacing Amplitude: 2 V
Lead Channel Setting Pacing Amplitude: 2.5 V
MDC IDC LEAD IMPLANT DT: 20180522
MDC IDC LEAD IMPLANT DT: 20180522
MDC IDC LEAD LOCATION: 753859
MDC IDC MSMT BATTERY REMAINING PERCENTAGE: 95.5 %
MDC IDC MSMT BATTERY VOLTAGE: 2.99 V
MDC IDC MSMT LEADCHNL RA SENSING INTR AMPL: 1.5 mV
MDC IDC SESS DTM: 20190305064034
MDC IDC SET LEADCHNL RV PACING PULSEWIDTH: 0.5 ms
MDC IDC SET LEADCHNL RV SENSING SENSITIVITY: 2 mV
MDC IDC STAT BRADY RV PERCENT PACED: 8.8 %
Pulse Gen Model: 2272
Pulse Gen Serial Number: 8908368

## 2018-01-28 NOTE — Telephone Encounter (Signed)
LMOM to call back to [facility].  Need to schedule appt with WC to discuss recommendations- previously refused diltiazem for AF RVR episodes.

## 2018-01-28 NOTE — Progress Notes (Signed)
Remote pacemaker transmission.   

## 2018-01-29 ENCOUNTER — Encounter: Payer: Self-pay | Admitting: Cardiology

## 2018-01-31 NOTE — Telephone Encounter (Signed)
-----   Message from Will Meredith Leeds, MD sent at 01/28/2018  1:46 PM EST ----- Abnormal device interrogation reviewed.  Lead parameters and battery status stable.  AF rates in 190s. Diltiazem 180 mg.

## 2018-01-31 NOTE — Telephone Encounter (Signed)
LVM for pt to call device clinic back. 

## 2018-02-03 NOTE — Telephone Encounter (Signed)
Patient agreeable to appointment- he does not want to take Narya Beavin term medications without a discussion with Dr. Curt Bears. Scheduled for 02/24/18.

## 2018-02-24 ENCOUNTER — Ambulatory Visit: Payer: Managed Care, Other (non HMO) | Admitting: Cardiology

## 2018-02-24 ENCOUNTER — Encounter: Payer: Self-pay | Admitting: Cardiology

## 2018-02-24 VITALS — BP 120/82 | HR 70 | Ht 68.0 in | Wt 138.0 lb

## 2018-02-24 DIAGNOSIS — I493 Ventricular premature depolarization: Secondary | ICD-10-CM | POA: Diagnosis not present

## 2018-02-24 DIAGNOSIS — I495 Sick sinus syndrome: Secondary | ICD-10-CM

## 2018-02-24 DIAGNOSIS — Z45018 Encounter for adjustment and management of other part of cardiac pacemaker: Secondary | ICD-10-CM | POA: Diagnosis not present

## 2018-02-24 DIAGNOSIS — I480 Paroxysmal atrial fibrillation: Secondary | ICD-10-CM

## 2018-02-24 LAB — CUP PACEART INCLINIC DEVICE CHECK
Battery Remaining Longevity: 108 mo
Implantable Lead Implant Date: 20180522
Implantable Lead Location: 753860
Implantable Pulse Generator Implant Date: 20180522
Lead Channel Impedance Value: 487.5 Ohm
Lead Channel Sensing Intrinsic Amplitude: 10.1 mV
Lead Channel Setting Pacing Amplitude: 1.625
Lead Channel Setting Sensing Sensitivity: 2 mV
MDC IDC LEAD IMPLANT DT: 20180522
MDC IDC LEAD LOCATION: 753859
MDC IDC MSMT BATTERY VOLTAGE: 3.01 V
MDC IDC MSMT LEADCHNL RA IMPEDANCE VALUE: 475 Ohm
MDC IDC MSMT LEADCHNL RA PACING THRESHOLD AMPLITUDE: 0.625 V
MDC IDC MSMT LEADCHNL RA PACING THRESHOLD PULSEWIDTH: 0.5 ms
MDC IDC MSMT LEADCHNL RA SENSING INTR AMPL: 1.9 mV
MDC IDC PG SERIAL: 8908368
MDC IDC SESS DTM: 20190401143834
MDC IDC SET LEADCHNL RV PACING AMPLITUDE: 2.5 V
MDC IDC SET LEADCHNL RV PACING PULSEWIDTH: 0.5 ms
MDC IDC STAT BRADY RA PERCENT PACED: 87 %
MDC IDC STAT BRADY RV PERCENT PACED: 8.8 %
Pulse Gen Model: 2272

## 2018-02-24 NOTE — Progress Notes (Signed)
Electrophysiology Office Note   Date:  02/24/2018   ID:  TRIGGER FRASIER, DOB 1946/07/13, MRN [id number]  PCP:  Antony Contras, MD  Cardiologist:  Hochrein Primary Electrophysiologist:  Hasaan Radde Meredith Leeds, MD    Chief Complaint  Patient presents with  . Defib Check    PAD/Sinus node dysfunction     History of Present Illness: Johnathan [name] is a 72 y.o. male who is being seen today for the evaluation of sinus bradycardia, PVCs at the request of Antony Contras, MD. Presenting today for electrophysiology evaluation. He was evaluated with an exercise treadmill test and was found to have profound fatigue and decreased exercise tolerance. He had ST segment depression at peak exercise, but his cardiac catheterization was normal. He presented to cardiology clinic after an EKG on 3/19 performed by his PCP which showed sinus bradycardia with rates in the 30s. St. Jude dual chamber pacemaker implanted 6/64/40 complicated by pneumothorax.  Today, denies symptoms of palpitations, chest pain, shortness of breath, orthopnea, PND, lower extremity edema, claudication, dizziness, presyncope, syncope, bleeding, or neurologic sequela. The patient is tolerating medications without difficulties.  Overall he is feeling well.  He does continue to have episodes of fatigue.  He says that he feels like the pacemaker is making him feel better.   Past Medical History:  Diagnosis Date  . Anemia   . Bradycardia   . GERD (gastroesophageal reflux disease)   . H/O: GI bleed   . Herniated disc, cervical   . Hiatal hernia   . Hyperlipidemia   . Metacarpal bone fracture   . Presence of permanent cardiac pacemaker 04/16/2017  . Rosacea, acne   . SSS (sick sinus syndrome) [facility])    Past Surgical History:  Procedure Laterality Date  . CARDIAC CATHETERIZATION N/A 06/20/2015   Procedure: Left Heart Cath and Coronary Angiography;  Surgeon: Troy Sine, MD;  Location: [facility];  Service:  Cardiovascular;  Laterality: N/A;  . CERVICAL FUSION    . COLONOSCOPY WITH PROPOFOL N/A 06/13/2015   Procedure: COLONOSCOPY WITH PROPOFOL;  Surgeon: Arta Silence, MD;  Location: [facility];  Service: Endoscopy;  Laterality: N/A;  . ESOPHAGOGASTRODUODENOSCOPY (EGD) WITH PROPOFOL N/A 06/13/2015   Procedure: ESOPHAGOGASTRODUODENOSCOPY (EGD) WITH PROPOFOL;  Surgeon: Arta Silence, MD;  Location: [facility];  Service: Endoscopy;  Laterality: N/A;  . HERNIA REPAIR    . INSERT / REPLACE / REMOVE PACEMAKER    . MYOTOMY    . NISSEN FUNDOPLICATION  3474  . PACEMAKER IMPLANT N/A 04/16/2017   Procedure: Pacemaker Implant;  Surgeon: Constance Haw, MD;  Location: [facility];  Service: Cardiovascular;  Laterality: N/A;  . VARICOCELE EXCISION Left      Current Outpatient Medications  Medication Sig Dispense Refill  . Ascorbic Acid (VITAMIN C) 1000 MG tablet Take 1,000 mg by mouth daily.    Marland Kitchen azelastine (ASTELIN) 0.1 % nasal spray Place 1 spray into both nostrils as needed for rhinitis. Use in each nostril as directed    . Iron-FA-B Cmp-C-Biot-Probiotic (FUSION PLUS) CAPS Take 1 tablet by mouth daily. As Needed  1  . omeprazole (PRILOSEC) 40 MG capsule Take 40 mg by mouth daily.     No current facility-administered medications for this visit.     Allergies:   Patient has no known allergies.   Social History:  The patient  reports that he has never smoked. He has never used smokeless tobacco. He reports that he does not drink alcohol or use  drugs.   Family History:  The patient's family history includes Breast cancer in his mother; Diabetes in his father and paternal grandmother; Heart disease in his father.   ROS:  Please see the history of present illness.   Otherwise, review of systems is positive for none.   All other systems are reviewed and negative.   PHYSICAL EXAM: VS:  BP 120/82   Pulse 70   Ht 5\' 8"  (1.727 m)   Wt 138 lb (62.6 kg)   BMI 20.98 kg/m  , BMI Body mass  index is 20.98 kg/m. GEN: Well nourished, well developed, in no acute distress  HEENT: normal  Neck: no JVD, carotid bruits, or masses Cardiac: RRR; no murmurs, rubs, or gallops,no edema  Respiratory:  clear to auscultation bilaterally, normal work of breathing GI: soft, nontender, nondistended, + BS MS: no deformity or atrophy  Skin: warm and dry, device site well healed Neuro:  Strength and sensation are intact Psych: euthymic mood, full affect  EKG:  EKG is ordered today. Personal review of the ekg ordered shows A paced, rate 70  Personal review of the device interrogation today. Results in [place]: 04/08/2017: BUN 22; Creatinine, Ser 0.93; Hemoglobin 14.4; Platelets 242; Potassium 4.9; Sodium 142    Lipid Panel     Component Value Date/Time   CHOL  12/06/2010 0620    174        ATP III CLASSIFICATION:  <200     mg/dL   Desirable  200-239  mg/dL   Borderline High  >=240    mg/dL   High          TRIG 109 12/06/2010 0620   HDL 59 12/06/2010 0620   CHOLHDL 2.9 12/06/2010 0620   VLDL 22 12/06/2010 0620   LDLCALC  12/06/2010 0620    93        Total Cholesterol/HDL:CHD Risk Coronary Heart Disease Risk Table                     Men   Women  1/2 Average Risk   3.4   3.3  Average Risk       5.0   4.4  2 X Average Risk   9.6   7.1  3 X Average Risk  23.4   11.0        Use the calculated Patient Ratio above and the CHD Risk Table to determine the patient's CHD Risk.        ATP III CLASSIFICATION (LDL):  <100     mg/dL   Optimal  100-129  mg/dL   Near or Above                    Optimal  130-159  mg/dL   Borderline  160-189  mg/dL   High  >190     mg/dL   Very High     Wt Readings from Last 3 Encounters:  02/24/18 138 lb (62.6 kg)  07/23/17 137 lb 9.6 oz (62.4 kg)  04/16/17 140 lb (63.5 kg)      Other studies Reviewed: Additional studies/ records that were reviewed today include: TTE 03/11/17, Cath 2016  Review of the above records today  demonstrates:  - Left ventricle: The cavity size was moderately dilated. Systolic   function was normal. Wall motion was normal; there were no   regional wall motion abnormalities. Features are consistent with   a pseudonormal left ventricular filling pattern, with  concomitant   abnormal relaxation and increased filling pressure (grade 2   diastolic dysfunction). - Aortic valve: Trileaflet; mildly thickened, mildly calcified   leaflets. - Mitral valve: There was mild regurgitation. - Right ventricle: The cavity size was mildly dilated. Wall   thickness was normal. - Right atrium: The atrium was mildly dilated. - Tricuspid valve: There was mild regurgitation directed   eccentrically. - Pulmonary arteries: PA peak pressure: 42 mm Hg (S). - Impressions: The right ventricular systolic pressure was   increased consistent with moderate pulmonary hypertension.    The left ventricular systolic function is normal.   Normal LV function with an ejection fraction of 50-55%.  No evidence for coronary obstructive disease with a normal LAD with evidence for a mid LAD intramyocardial segment without systolic bridging, a normal circumflex and normal dominant RCA.  ASSESSMENT AND PLAN:  1.  PVCs: Well without major issue.  PVCs are quite less now that he has pacemaker implanted.  No changes.  2. Sinus node dysfunction: Status post Saint Jude dual-chamber pacemaker implanted 04/16/17.  Functioning appropriately.  No changes.    3. Paroxysmal atrial fibrillation: Found on device interrogation.  He has had minimal symptoms and short-lived episode.  No anticoagulation at this time.  This patients CHA2DS2-VASc Score and unadjusted Ischemic Stroke Rate (% per year) is equal to 0.6 % stroke rate/year from a score of 1  Above score calculated as 1 point each if present [CHF, HTN, DM, Vascular=MI/PAD/Aortic Plaque, Age if 65-74, or Male] Above score calculated as 2 points each if present [Age > 75, or  Stroke/TIA/TE]  Current medicines are reviewed at length with the patient today.   The patient does not have concerns regarding his medicines.  The following changes were made today: None  Labs/ tests ordered today include:  Orders Placed This Encounter  Procedures  . EKG 12-Lead     Disposition:   FU with Wendelin Bradt 12 months  Signed, Emiliano Welshans Meredith Leeds, MD  02/24/2018 11:09 AM     [facility] [address] [place]  [phone] (office) [phone] (fax)

## 2018-02-24 NOTE — Patient Instructions (Signed)
Medication Instructions:  Your physician recommends that you continue on your current medications as directed. Please refer to the Current Medication list given to you today.  *If you need a refill on your cardiac medications before your next appointment, please call your pharmacy*  Labwork: None ordered  Testing/Procedures: None ordered  Follow-Up: Remote monitoring is used to monitor your Pacemaker or ICD from home. This monitoring reduces the number of office visits required to check your device to one time per year. It allows [place] to keep an eye on the functioning of your device to ensure it is working properly. You are scheduled for a device check from home on 04/28/2018. You may send your transmission at any time that day. If you have a wireless device, the transmission will be sent automatically. After your physician reviews your transmission, you will receive a postcard with your next transmission date.  Your physician wants you to follow-up in: 1 year with Dr. Curt Bears.  You will receive a reminder letter in the mail two months in advance. If you don't receive a letter, please call our office to schedule the follow-up appointment.  Thank you for choosing [facility]!!   Trinidad Curet, RN [phone]

## 2018-04-28 ENCOUNTER — Telehealth: Payer: Self-pay | Admitting: Cardiology

## 2018-04-28 ENCOUNTER — Ambulatory Visit (INDEPENDENT_AMBULATORY_CARE_PROVIDER_SITE_OTHER): Payer: Managed Care, Other (non HMO) | Admitting: *Deleted

## 2018-04-28 DIAGNOSIS — I495 Sick sinus syndrome: Secondary | ICD-10-CM | POA: Diagnosis not present

## 2018-04-28 NOTE — Telephone Encounter (Signed)
Spoke with pt and reminded pt of remote transmission that is due today. Pt verbalized understanding.   

## 2018-04-29 NOTE — Progress Notes (Signed)
Remote pacemaker transmission.   

## 2018-04-30 ENCOUNTER — Encounter: Payer: Self-pay | Admitting: Cardiology

## 2018-05-15 LAB — CUP PACEART REMOTE DEVICE CHECK
Brady Statistic AP VP Percent: 9.8 %
Brady Statistic AP VS Percent: 73 %
Brady Statistic AS VS Percent: 17 %
Brady Statistic RA Percent Paced: 83 %
Brady Statistic RV Percent Paced: 9.8 %
Date Time Interrogation Session: 20190603190545
Implantable Lead Implant Date: 20180522
Implantable Lead Location: 753860
Lead Channel Impedance Value: 490 Ohm
Lead Channel Pacing Threshold Amplitude: 0.5 V
Lead Channel Pacing Threshold Pulse Width: 0.5 ms
Lead Channel Pacing Threshold Pulse Width: 0.5 ms
Lead Channel Sensing Intrinsic Amplitude: 9.5 mV
Lead Channel Setting Pacing Amplitude: 1.75 V
Lead Channel Setting Pacing Amplitude: 2.5 V
Lead Channel Setting Pacing Pulse Width: 0.5 ms
Lead Channel Setting Sensing Sensitivity: 2 mV
MDC IDC LEAD IMPLANT DT: 20180522
MDC IDC LEAD LOCATION: 753859
MDC IDC MSMT BATTERY REMAINING LONGEVITY: 112 mo
MDC IDC MSMT BATTERY REMAINING PERCENTAGE: 95.5 %
MDC IDC MSMT BATTERY VOLTAGE: 2.99 V
MDC IDC MSMT LEADCHNL RA IMPEDANCE VALUE: 450 Ohm
MDC IDC MSMT LEADCHNL RA PACING THRESHOLD AMPLITUDE: 0.75 V
MDC IDC MSMT LEADCHNL RA SENSING INTR AMPL: 1.8 mV
MDC IDC PG IMPLANT DT: 20180522
MDC IDC STAT BRADY AS VP PERCENT: 1 %
Pulse Gen Model: 2272
Pulse Gen Serial Number: 8908368

## 2018-07-29 ENCOUNTER — Ambulatory Visit (INDEPENDENT_AMBULATORY_CARE_PROVIDER_SITE_OTHER): Payer: Managed Care, Other (non HMO) | Admitting: *Deleted

## 2018-07-29 DIAGNOSIS — I495 Sick sinus syndrome: Secondary | ICD-10-CM

## 2018-07-29 NOTE — Progress Notes (Signed)
Remote pacemaker transmission.   

## 2018-08-25 ENCOUNTER — Telehealth: Payer: Self-pay | Admitting: *Deleted

## 2018-08-25 LAB — CUP PACEART REMOTE DEVICE CHECK
Battery Remaining Longevity: 115 mo
Brady Statistic AP VP Percent: 9.6 %
Brady Statistic AP VS Percent: 75 %
Brady Statistic AS VP Percent: 1 %
Brady Statistic RA Percent Paced: 84 %
Implantable Lead Implant Date: 20180522
Implantable Lead Location: 753859
Implantable Lead Location: 753860
Lead Channel Impedance Value: 510 Ohm
Lead Channel Impedance Value: 540 Ohm
Lead Channel Pacing Threshold Amplitude: 0.5 V
Lead Channel Pacing Threshold Pulse Width: 0.5 ms
Lead Channel Sensing Intrinsic Amplitude: 12 mV
Lead Channel Setting Pacing Amplitude: 1.625
Lead Channel Setting Pacing Amplitude: 2.5 V
Lead Channel Setting Pacing Pulse Width: 0.5 ms
MDC IDC LEAD IMPLANT DT: 20180522
MDC IDC MSMT BATTERY REMAINING PERCENTAGE: 95.5 %
MDC IDC MSMT BATTERY VOLTAGE: 3.01 V
MDC IDC MSMT LEADCHNL RA PACING THRESHOLD AMPLITUDE: 0.625 V
MDC IDC MSMT LEADCHNL RA PACING THRESHOLD PULSEWIDTH: 0.5 ms
MDC IDC MSMT LEADCHNL RA SENSING INTR AMPL: 2.4 mV
MDC IDC PG IMPLANT DT: 20180522
MDC IDC PG SERIAL: 8908368
MDC IDC SESS DTM: 20190903055013
MDC IDC SET LEADCHNL RV SENSING SENSITIVITY: 2 mV
MDC IDC STAT BRADY AS VS PERCENT: 16 %
MDC IDC STAT BRADY RV PERCENT PACED: 9.6 %

## 2018-08-25 NOTE — Telephone Encounter (Signed)
Attempted to call patient x 3 - unable to reach patient.sss

## 2018-08-25 NOTE — Telephone Encounter (Signed)
-----   Message from Will Meredith Leeds, MD sent at 08/25/2018  4:15 PM EDT ----- Normal remote reviewed. Battery and lead parameters stable.  Short episodes of rapid atrial fibrillation seen.  All less than 3 minutes.  If asymptomatic, no changes.  If symptoms, start metoprolol 25 mg twice a day.

## 2018-10-14 ENCOUNTER — Other Ambulatory Visit: Payer: Self-pay | Admitting: Family Medicine

## 2018-10-16 ENCOUNTER — Other Ambulatory Visit: Payer: Self-pay | Admitting: Family Medicine

## 2018-10-16 DIAGNOSIS — M5432 Sciatica, left side: Secondary | ICD-10-CM

## 2018-10-17 ENCOUNTER — Ambulatory Visit
Admission: RE | Admit: 2018-10-17 | Discharge: 2018-10-17 | Disposition: A | Discharge status: Home / Self Care | Payer: Managed Care, Other (non HMO) | Source: Ambulatory Visit | Attending: Family Medicine | Admitting: Family Medicine

## 2018-10-17 DIAGNOSIS — M5432 Sciatica, left side: Secondary | ICD-10-CM

## 2018-10-21 ENCOUNTER — Other Ambulatory Visit: Payer: Self-pay | Admitting: Student

## 2018-10-21 DIAGNOSIS — M5126 Other intervertebral disc displacement, lumbar region: Secondary | ICD-10-CM

## 2018-10-28 ENCOUNTER — Ambulatory Visit (INDEPENDENT_AMBULATORY_CARE_PROVIDER_SITE_OTHER): Payer: Managed Care, Other (non HMO)

## 2018-10-28 ENCOUNTER — Ambulatory Visit
Admission: RE | Admit: 2018-10-28 | Discharge: 2018-10-28 | Disposition: A | Discharge status: Home / Self Care | Payer: Managed Care, Other (non HMO) | Source: Ambulatory Visit | Attending: Student | Admitting: Student

## 2018-10-28 DIAGNOSIS — M5126 Other intervertebral disc displacement, lumbar region: Secondary | ICD-10-CM

## 2018-10-28 DIAGNOSIS — I495 Sick sinus syndrome: Secondary | ICD-10-CM | POA: Diagnosis not present

## 2018-10-28 MED ORDER — IOPAMIDOL (ISOVUE-M 200) INJECTION 41%
1.0000 mL | Freq: Once | INTRAMUSCULAR | Status: AC
  Administered 2018-10-28: 1 mL via EPIDURAL

## 2018-10-28 MED ORDER — METHYLPREDNISOLONE ACETATE 40 MG/ML INJ SUSP (RADIOLOG
120.0000 mg | Freq: Once | INTRAMUSCULAR | Status: AC
  Administered 2018-10-28: 120 mg via EPIDURAL

## 2018-10-28 NOTE — Discharge Instructions (Signed)

## 2018-10-28 NOTE — Progress Notes (Signed)
Remote pacemaker transmission.   

## 2018-11-03 NOTE — Telephone Encounter (Signed)
Called and requested that patient send in a manual transmission. Patient verbalized understanding and is aware that he will receive a call back tomorrow.

## 2018-11-04 ENCOUNTER — Encounter: Payer: Self-pay | Admitting: Cardiology

## 2018-11-05 ENCOUNTER — Telehealth: Payer: Self-pay | Admitting: *Deleted

## 2018-11-05 NOTE — Telephone Encounter (Signed)
PPM transmission received on 11/03/18 and reviewed. 82% Ap, 9.1% Vp. <1% AT/AF burden, most recent AF episode occurred on 12/2, duration 46sec, detected as HVR (only HVR since beginning of December). Histograms appropriate.  Pt reports dizziness is new within the past couple of weeks, lasts only a second at a time. He feels fuzzy-headed, then he carries on. Not associated with any other symptoms, no palpitations or ShOB. Usually occurs while he has been standing or walking. No dizziness with position changes. Has no way to check BP at home. Pt is unsure how "low" his BP was at his appt last week.  Pt has been dealing with sciatica for the past month, moderate at first, then severe the past two weeks. Taking ibuprofen for pain and seeing neurosurgeon. Noted that his left foot is very swollen, right foot also swollen but less than left, going on for 2.5-3 weeks. Appt with PCP on Friday to assess BLE swelling.  Advised patient that his PPM function is normal and none of these symptoms seem to be related to his HR/rhythm. Will route to Dr. Curt Bears and Venida Jarvis, RN, for any further advisement.

## 2018-11-05 NOTE — Telephone Encounter (Signed)
From MyChart message on 11/03/18:  On 10-28-18 I had a remote download, later that day I had a low-back pain injection that was guided by x-ray. Later that week I visited a doctor's office where my blood pressure was taken, the nurse said it was a bit low. I've also been experiencing short periods of momentary dizziness. These are the same symptoms that I had before the pacemaker.   So here's the question. Did that x-ray procedure do something to the pacemaker function? Right now I'm dealing with extreme sciatica and probably will be getting a few more low-back pain injections.   By the way, trying to get in touch with your office is practically impossible due to outrageous wait times. I also have my doubts about this e-mail.   Looking forward to hearing from you  [employer]

## 2018-11-05 NOTE — Telephone Encounter (Signed)
[facility] requesting call back from patient. Gave direct number to DC and requested he call us back to discuss symptoms in more detail.

## 2018-11-05 NOTE — Telephone Encounter (Signed)
See phone note opened 11/05/18.

## 2018-11-07 ENCOUNTER — Ambulatory Visit (HOSPITAL_COMMUNITY)
Admission: RE | Admit: 2018-11-07 | Discharge: 2018-11-07 | Disposition: A | Discharge status: Home / Self Care | Payer: Managed Care, Other (non HMO) | Source: Ambulatory Visit | Attending: Family Medicine | Admitting: Family Medicine

## 2018-11-07 ENCOUNTER — Other Ambulatory Visit (HOSPITAL_COMMUNITY): Payer: Self-pay | Admitting: Family Medicine

## 2018-11-07 DIAGNOSIS — M7989 Other specified soft tissue disorders: Secondary | ICD-10-CM | POA: Insufficient documentation

## 2018-11-07 NOTE — Telephone Encounter (Signed)
Unlikely due to rhythm as no issues on PPM. Would discuss with PCP.

## 2018-11-07 NOTE — Telephone Encounter (Signed)
Left message on voicemail advising pt to f/u w/ PCP

## 2018-11-11 ENCOUNTER — Other Ambulatory Visit: Payer: Self-pay | Admitting: Family Medicine

## 2018-11-11 ENCOUNTER — Telehealth: Payer: Self-pay

## 2018-11-11 DIAGNOSIS — D509 Iron deficiency anemia, unspecified: Secondary | ICD-10-CM

## 2018-11-11 DIAGNOSIS — R600 Localized edema: Secondary | ICD-10-CM

## 2018-11-11 DIAGNOSIS — R634 Abnormal weight loss: Secondary | ICD-10-CM

## 2018-11-11 NOTE — Telephone Encounter (Signed)
   [facility] Pre-operative Risk Assessment    Request for surgical clearance:  1. What type of surgery is being performed?  Left L5-S1 Lumbar Microdiskectomy   2. When is this surgery scheduled?  TBD   3. What type of clearance is required (medical clearance vs. Pharmacy clearance to hold med vs. Both)?  medical  4. Are there any medications that need to be held prior to surgery and how long?    5. Practice name and name of physician performing surgery?  [facility]/ Dr Ronnald Ramp   6. What is your office phone number [phone]    7.   What is your office fax number [phone]  8.   Anesthesia type (None, local, MAC, general) ?  general   Johnathan [name] 11/11/2018, 1:50 PM  _________________________________________________________________   (provider comments below)

## 2018-11-14 NOTE — Telephone Encounter (Signed)
   Primary Cardiologist: Minus Breeding, MD  Chart reviewed as part of pre-operative protocol coverage. Pt contacted by phone today. He is stable w/o any cardiac symptoms. Given past medical history and time since last visit, based on ACC/AHA guidelines, Johnathan [name] would be at acceptable risk for the planned procedure without further cardiovascular testing.   I will route this recommendation to the requesting party via Epic fax function and remove from pre-op pool.  Please call with questions.  Lyda Jester, PA-C 11/14/2018, 2:22 PM

## 2018-11-14 NOTE — Telephone Encounter (Signed)
   Primary Cardiologist: Minus Breeding, MD  Will Meredith Leeds, MD  Pt had normal cath in 2016 and followed primary now for SSS s/p PPM. Last OV was 02/2018. Phone call placed to check status of pt to see if clearance can be made by phone vs need for possible office visit. LVM to call back.   Lyda Jester, PA-C 11/14/2018, 10:52 AM

## 2018-11-17 ENCOUNTER — Other Ambulatory Visit (HOSPITAL_COMMUNITY): Payer: Self-pay | Admitting: *Deleted

## 2018-11-17 ENCOUNTER — Ambulatory Visit
Admission: RE | Admit: 2018-11-17 | Discharge: 2018-11-17 | Disposition: A | Discharge status: Home / Self Care | Payer: Managed Care, Other (non HMO) | Source: Ambulatory Visit | Attending: Family Medicine | Admitting: Family Medicine

## 2018-11-17 DIAGNOSIS — R600 Localized edema: Secondary | ICD-10-CM

## 2018-11-17 DIAGNOSIS — R634 Abnormal weight loss: Secondary | ICD-10-CM

## 2018-11-17 DIAGNOSIS — D509 Iron deficiency anemia, unspecified: Secondary | ICD-10-CM

## 2018-11-17 MED ORDER — IOPAMIDOL (ISOVUE-300) INJECTION 61%
100.0000 mL | Freq: Once | INTRAVENOUS | Status: AC | PRN
  Administered 2018-11-17: 100 mL via INTRAVENOUS

## 2018-11-18 ENCOUNTER — Ambulatory Visit (HOSPITAL_COMMUNITY)
Admission: RE | Admit: 2018-11-18 | Discharge: 2018-11-18 | Disposition: A | Discharge status: Home / Self Care | Payer: Managed Care, Other (non HMO) | Source: Ambulatory Visit | Attending: Gastroenterology | Admitting: Gastroenterology

## 2018-11-18 DIAGNOSIS — D649 Anemia, unspecified: Secondary | ICD-10-CM | POA: Insufficient documentation

## 2018-11-18 MED ORDER — SODIUM CHLORIDE 0.9 % IV SOLN
510.0000 mg | Freq: Once | INTRAVENOUS | Status: AC
  Administered 2018-11-18: 510 mg via INTRAVENOUS
  Filled 2018-11-18: qty 510

## 2018-11-18 NOTE — Discharge Instructions (Signed)

## 2018-11-20 ENCOUNTER — Other Ambulatory Visit: Payer: Self-pay | Admitting: Neurological Surgery

## 2018-12-16 NOTE — Pre-Procedure Instructions (Signed)
Johnathan [name]  12/16/2018      [facility] [address] AT [facility] [address] [place] [address] Phone: [phone] Fax: [phone]    Your procedure is scheduled on January 30th.  Report to [facility] Admitting at 7:45 A.M.  Call this number if you have problems the morning of surgery:  [phone]   Remember:  Do not eat or drink after midnight.     Take these medicines the morning of surgery with A SIP OF WATER   Azelastine (Astelin) Nasal Spray - if needed  Omeprazole (Prilosec) - if needed   7 days prior to surgery STOP taking any Aspirin (unless otherwise instructed by your surgeon), Aleve, Naproxen, Ibuprofen, Motrin, Advil, Goody's, BC's, all herbal medications, fish oil, and all vitamins.     Do not wear jewelry.  Do not wear lotions, powders, colognes, or deodorant.  Men may shave face and neck.  Do not bring valuables to the hospital.  [facility] is not responsible for any belongings or valuables.   [place]- Preparing For Surgery  Before surgery, you can play an important role. Because skin is not sterile, your skin needs to be as free of germs as possible. You can reduce the number of germs on your skin by washing with CHG (chlorahexidine gluconate) Soap before surgery.  CHG is an antiseptic cleaner which kills germs and bonds with the skin to continue killing germs even after washing.    Oral Hygiene is also important to reduce your risk of infection.  Remember - BRUSH YOUR TEETH THE MORNING OF SURGERY WITH YOUR REGULAR TOOTHPASTE  Please do not use if you have an allergy to CHG or antibacterial soaps. If your skin becomes reddened/irritated stop using the CHG.  Do not shave (including legs and underarms) for at least 48 hours prior to first CHG shower. It is OK to shave your face.  Please follow these instructions carefully.   1. Shower the NIGHT BEFORE  SURGERY and the MORNING OF SURGERY with CHG.   2. If you chose to wash your hair, wash your hair first as usual with your normal shampoo.  3. After you shampoo, rinse your hair and body thoroughly to remove the shampoo.  4. Use CHG as you would any other liquid soap. You can apply CHG directly to the skin and wash gently with a scrungie or a clean washcloth.   5. Apply the CHG Soap to your body ONLY FROM THE NECK DOWN.  Do not use on open wounds or open sores. Avoid contact with your eyes, ears, mouth and genitals (private parts). Wash Face and genitals (private parts)  with your normal soap.  6. Wash thoroughly, paying special attention to the area where your surgery will be performed.  7. Thoroughly rinse your body with warm water from the neck down.  8. DO NOT shower/wash with your normal soap after using and rinsing off the CHG Soap.  9. Pat yourself dry with a CLEAN TOWEL.  10. Wear CLEAN PAJAMAS to bed the night before surgery, wear comfortable clothes the morning of surgery  11. Place CLEAN SHEETS on your bed the night of your first shower and DO NOT SLEEP WITH PETS.   Day of Surgery:  Do not apply any deodorants/lotions.  Please wear clean clothes to the hospital/surgery center.   Remember to brush your teeth WITH YOUR REGULAR TOOTHPASTE.  Contacts, dentures or bridgework may not be worn into surgery.  Leave your suitcase in the car.  After surgery it may be brought to your room.  For patients admitted to the hospital, discharge time will be determined by your treatment team.  Patients discharged the day of surgery will not be allowed to drive home.    Please read over the following fact sheets that you were given. Coughing and Deep Breathing, MRSA Information and Surgical Site Infection Prevention

## 2018-12-17 ENCOUNTER — Encounter (HOSPITAL_COMMUNITY)
Admission: RE | Admit: 2018-12-17 | Discharge: 2018-12-17 | Disposition: A | Discharge status: Home / Self Care | Payer: Managed Care, Other (non HMO) | Source: Ambulatory Visit | Attending: Neurological Surgery | Admitting: Neurological Surgery

## 2018-12-17 ENCOUNTER — Other Ambulatory Visit: Payer: Self-pay

## 2018-12-17 ENCOUNTER — Ambulatory Visit (HOSPITAL_COMMUNITY)
Admission: RE | Admit: 2018-12-17 | Discharge: 2018-12-17 | Disposition: A | Discharge status: Home / Self Care | Payer: Managed Care, Other (non HMO) | Source: Ambulatory Visit | Attending: Neurological Surgery | Admitting: Neurological Surgery

## 2018-12-17 ENCOUNTER — Encounter (HOSPITAL_COMMUNITY): Payer: Self-pay

## 2018-12-17 DIAGNOSIS — M5126 Other intervertebral disc displacement, lumbar region: Secondary | ICD-10-CM | POA: Diagnosis not present

## 2018-12-17 LAB — BASIC METABOLIC PANEL
Anion gap: 8 (ref 5–15)
BUN: 15 mg/dL (ref 8–23)
CHLORIDE: 105 mmol/L (ref 98–111)
CO2: 28 mmol/L (ref 22–32)
Calcium: 9.1 mg/dL (ref 8.9–10.3)
Creatinine, Ser: 0.73 mg/dL (ref 0.61–1.24)
GFR calc Af Amer: >60 mL/min (ref >60)
GFR calc non Af Amer: >60 mL/min (ref >60)
Glucose, Bld: 106 mg/dL — ABNORMAL HIGH (ref 70–99)
Potassium: 3.4 mmol/L — ABNORMAL LOW (ref 3.5–5.1)
Sodium: 141 mmol/L (ref 135–145)

## 2018-12-17 LAB — CBC WITH DIFFERENTIAL/PLATELET
ABS IMMATURE GRANULOCYTES: 0.03 10*3/uL (ref 0.00–0.07)
Basophils Absolute: 0.1 10*3/uL (ref 0.0–0.1)
Basophils Relative: 1 %
Eosinophils Absolute: 0.2 10*3/uL (ref 0.0–0.5)
Eosinophils Relative: 3 %
HCT: 39.6 % (ref 39.0–52.0)
HEMOGLOBIN: 12.6 g/dL — AB (ref 13.0–17.0)
Immature Granulocytes: 1 %
Lymphocytes Relative: 15 %
Lymphs Abs: 0.8 10*3/uL (ref 0.7–4.0)
MCH: 30.7 pg (ref 26.0–34.0)
MCHC: 31.8 g/dL (ref 30.0–36.0)
MCV: 96.6 fL (ref 80.0–100.0)
Monocytes Absolute: 0.7 10*3/uL (ref 0.1–1.0)
Monocytes Relative: 13 %
NEUTROS ABS: 3.7 10*3/uL (ref 1.7–7.7)
Neutrophils Relative %: 67 %
Platelets: 225 10*3/uL (ref 150–400)
RBC: 4.1 MIL/uL — ABNORMAL LOW (ref 4.22–5.81)
RDW: 12.9 % (ref 11.5–15.5)
WBC: 5.5 10*3/uL (ref 4.0–10.5)
nRBC: 0 % (ref 0.0–0.2)

## 2018-12-17 LAB — SURGICAL PCR SCREEN
MRSA, PCR: NEGATIVE
Staphylococcus aureus: NEGATIVE

## 2018-12-17 LAB — PROTIME-INR
INR: 0.97
Prothrombin Time: 12.8 seconds (ref 11.4–15.2)

## 2018-12-17 NOTE — Progress Notes (Signed)
PCP - Dr. Iline Oven  Cardiologist - Dr. Lenna Sciara. Hochrein/ PA Brittainy Rosita Fire C.C.- 11/14/18  Chest x-ray - 12/17/2018  EKG - 02/24/18 (E)  Stress Test - 05/2015 (E)  ECHO - 02/2017 (E)  Cardiac Cath - 05/2015 (E)  AICD- na PM- Yes- St. Jude- formed faxed to Dr. Curt Bears- 12/16/2018 LOOP- na  Sleep Study - Denies CPAP - None  LABS- 12/17/2018: CBC w/D, BMP, PT, PCR  ASA- Denies   Anesthesia- Yes- cardiac history- Pt also c/o swelling and pain to the left foot that started around the same time as the lower back pain radiating down the left leg. Pt sts he has been seen for it, but they are unable to figure out why his foot is swollen.  Pt denies having chest pain, sob, or fever at this time. All instructions explained to the pt, with a verbal understanding of the material. Pt agrees to go over the instructions while at home for a better understanding. The opportunity to ask questions was provided.

## 2018-12-18 ENCOUNTER — Ambulatory Visit (HOSPITAL_COMMUNITY): Payer: Managed Care, Other (non HMO) | Admitting: Anesthesiology

## 2018-12-18 ENCOUNTER — Ambulatory Visit (HOSPITAL_COMMUNITY): Payer: Managed Care, Other (non HMO) | Admitting: Physician Assistant

## 2018-12-18 LAB — CUP PACEART REMOTE DEVICE CHECK
Battery Remaining Longevity: 116 mo
Battery Remaining Percentage: 95.5 %
Battery Voltage: 3.01 V
Brady Statistic AP VP Percent: 9.2 %
Brady Statistic AP VS Percent: 74 %
Brady Statistic AS VP Percent: 1 %
Brady Statistic AS VS Percent: 17 %
Brady Statistic RA Percent Paced: 82 %
Brady Statistic RV Percent Paced: 9.2 %
Implantable Lead Implant Date: 20180522
Implantable Lead Implant Date: 20180522
Implantable Lead Location: 753859
Implantable Lead Location: 753860
Implantable Pulse Generator Implant Date: 20180522
Lead Channel Impedance Value: 550 Ohm
Lead Channel Pacing Threshold Amplitude: 0.5 V
Lead Channel Pacing Threshold Amplitude: 0.5 V
Lead Channel Pacing Threshold Pulse Width: 0.5 ms
Lead Channel Pacing Threshold Pulse Width: 0.5 ms
Lead Channel Sensing Intrinsic Amplitude: 11.4 mV
Lead Channel Sensing Intrinsic Amplitude: 2.6 mV
Lead Channel Setting Pacing Amplitude: 1.5 V
Lead Channel Setting Pacing Amplitude: 2.5 V
Lead Channel Setting Pacing Pulse Width: 0.5 ms
Lead Channel Setting Sensing Sensitivity: 2 mV
MDC IDC MSMT LEADCHNL RV IMPEDANCE VALUE: 510 Ohm
MDC IDC SESS DTM: 20191202090015
Pulse Gen Model: 2272
Pulse Gen Serial Number: 8908368

## 2018-12-18 NOTE — Anesthesia Preprocedure Evaluation (Deleted)
Anesthesia Evaluation  Patient identified by MRN, date of birth, ID band Patient awake    Reviewed: Allergy & Precautions, NPO status , Patient's Chart, lab work & pertinent test results  Airway Mallampati: II  TM Distance: >3 FB     Dental   Pulmonary    breath sounds clear to auscultation       Cardiovascular + pacemaker + Valvular Problems/Murmurs  Rhythm:Regular Rate:Normal     Neuro/Psych    GI/Hepatic Neg liver ROS, hiatal hernia, GERD  ,  Endo/Other  negative endocrine ROS  Renal/GU negative Renal ROS     Musculoskeletal   Abdominal   Peds  Hematology  (+) anemia ,   Anesthesia Other Findings   Reproductive/Obstetrics                             Anesthesia Physical Anesthesia Plan  ASA: III  Anesthesia Plan: General   Post-op Pain Management:    Induction: Intravenous  PONV Risk Score and Plan: 2 and Ondansetron, Dexamethasone and Midazolam  Airway Management Planned:   Additional Equipment:   Intra-op Plan:   Post-operative Plan: Possible Post-op intubation/ventilation  Informed Consent: I have reviewed the patients History and Physical, chart, labs and discussed the procedure including the risks, benefits and alternatives for the proposed anesthesia with the patient or authorized representative who has indicated his/her understanding and acceptance.     Dental advisory given  Plan Discussed with: CRNA and Anesthesiologist  Anesthesia Plan Comments: (See PAT note by Karoline Caldwell, PA-C )       Anesthesia Quick Evaluation

## 2018-12-18 NOTE — Progress Notes (Signed)
Anesthesia Chart Review:  Case:  [id number] Date/Time:  12/25/18 0931   Procedure:  Microdiscectomy - left - L5-S1 (Left Back)   Anesthesia type:  General   Pre-op diagnosis:  HNP   Location:  [facility] OR ROOM 20 / [place] OR   Surgeon:  Eustace Moore, MD      DISCUSSION: 73 yo male never smoker. Pertinent hx includes SSS (s/p [employer]. Jude dual chamber pacemaker implanted 04/16/17), GERD, Mod pulm HTN followed by Dr. Percival Spanish.  Pt follows with Dr. Curt Bears for sinus node dysfunction s/p [facility] Jude dual-chamber pacemaker implanted 04/16/17. Device interrogation has revealed some short-lived paroxysmal afib. He is not anticoagulated at this time, CHA2DS2-VASc sore of 1.  Cardiac clearance per telephone encounter by Lyda Jester, PA-C 11/14/2018.  Device form on pt chart.  Anticipate he can proceed as planned barring acute status change.  VS: BP 127/84   Pulse 82   Temp (!) 36.4 C   Resp 20   Ht 5\' 8"  (1.727 m)   Wt 61.8 kg   SpO2 100%   BMI 20.72 kg/m   PROVIDERS: Antony Contras, MD is PCP  Neena Rhymes, MD is Cardiologist  Curt Bears, Will, MD is EP Cardiologsit  LABS: Labs reviewed: Acceptable for surgery. (all labs ordered are listed, but only abnormal results are displayed)  Labs Reviewed  BASIC METABOLIC PANEL - Abnormal; Notable for the following components:      Result Value   Potassium 3.4 (*)    Glucose, Bld 106 (*)    All other components within normal limits  CBC WITH DIFFERENTIAL/PLATELET - Abnormal; Notable for the following components:   RBC 4.10 (*)    Hemoglobin 12.6 (*)    All other components within normal limits  SURGICAL PCR SCREEN  PROTIME-INR     IMAGES: CHEST - 2 VIEW 12/17/2018  COMPARISON:  04/18/2017  FINDINGS: Heart size remains normal. Transvenous pacemaker remains in place. Both lungs are clear. Small hiatal hernia shows no significant change.  IMPRESSION: No active cardiopulmonary disease.  Small hiatal hernia.  EKG: 02/24/2018:  Electronic atrial pacemaker. Rate 70. Nonspecific ST and T wave abnormality.   CV: TTE 03/11/2017: Study Conclusions  - Left ventricle: The cavity size was moderately dilated. Systolic   function was normal. Wall motion was normal; there were no   regional wall motion abnormalities. Features are consistent with   a pseudonormal left ventricular filling pattern, with concomitant   abnormal relaxation and increased filling pressure (grade 2   diastolic dysfunction). - Aortic valve: Trileaflet; mildly thickened, mildly calcified   leaflets. - Mitral valve: There was mild regurgitation. - Right ventricle: The cavity size was mildly dilated. Wall   thickness was normal. - Right atrium: The atrium was mildly dilated. - Tricuspid valve: There was mild regurgitation directed   eccentrically. - Pulmonary arteries: PA peak pressure: 42 mm Hg (S). - Impressions: The right ventricular systolic pressure was   increased consistent with moderate pulmonary hypertension.  Impressions:  - Compared to study of 2016, PAP have increased from 79mmHg to   40mmHg. The right ventricular systolic pressure was increased   consistent with moderate pulmonary hypertension.  Cath 06/20/2015:  The left ventricular systolic function is normal.   Normal LV function with an ejection fraction of 50-55%.  No evidence for coronary obstructive disease with a normal LAD with evidence for a mid LAD intramyocardial segment without systolic bridging, a normal circumflex and normal dominant RCA.  RECOMMENDATION:  Medical therapy.  Consider possible small  vessel disease in the etiology of his size induced ST segment depression.  The patient will follow-up with Dr. Percival Spanish.   Past Medical History:  Diagnosis Date  . Anemia   . Bradycardia   . GERD (gastroesophageal reflux disease)   . H/O: GI bleed   . Herniated disc, cervical   . Hiatal hernia   . Hyperlipidemia   . Metacarpal bone fracture   . Presence  of permanent cardiac pacemaker 04/16/2017  . Rosacea, acne   . SSS (sick sinus syndrome) [facility])     Past Surgical History:  Procedure Laterality Date  . CARDIAC CATHETERIZATION N/A 06/20/2015   Procedure: Left Heart Cath and Coronary Angiography;  Surgeon: Troy Sine, MD;  Location: [place] CV LAB;  Service: Cardiovascular;  Laterality: N/A;  . CERVICAL FUSION    . COLONOSCOPY WITH PROPOFOL N/A 06/13/2015   Procedure: COLONOSCOPY WITH PROPOFOL;  Surgeon: Arta Silence, MD;  Location: [facility];  Service: Endoscopy;  Laterality: N/A;  . ESOPHAGOGASTRODUODENOSCOPY (EGD) WITH PROPOFOL N/A 06/13/2015   Procedure: ESOPHAGOGASTRODUODENOSCOPY (EGD) WITH PROPOFOL;  Surgeon: Arta Silence, MD;  Location: [facility] ENDOS[facility];  Service: Endoscopy;  Laterality: N/A;  . HERNIA REPAIR    . INSERT / REPLACE / REMOVE PACEMAKER    . MYOTOMY    . NISSEN FUNDOPLICATION  2952  . PACEMAKER IMPLANT N/A 04/16/2017   Procedure: Pacemaker Implant;  Surgeon: Constance Haw, MD;  Location: [place] CV LAB;  Service: Cardiovascular;  Laterality: N/A;  . VARICOCELE EXCISION Left     MEDICATIONS: . Ascorbic Acid (VITAMIN C) 1000 MG tablet  . azelastine (ASTELIN) 0.1 % nasal spray  . Iron-FA-B Cmp-C-Biot-Probiotic (FUSION PLUS) CAPS  . Multiple Vitamins-Minerals (MULTIVITAMIN PO)  . omeprazole (PRILOSEC) 20 MG capsule  . OVER THE COUNTER MEDICATION  . OVER THE COUNTER MEDICATION  . Turmeric 500 MG CAPS  . zinc gluconate 50 MG tablet   No current facility-administered medications for this encounter.     Wynonia Musty [facility] Short Stay Center/Anesthesiology Phone [phone] 12/18/2018 2:41 PM

## 2018-12-25 ENCOUNTER — Encounter (HOSPITAL_COMMUNITY)
Admission: RE | Disposition: A | Discharge status: Home / Self Care | Payer: Self-pay | Source: Home / Self Care | Attending: Neurological Surgery

## 2018-12-25 ENCOUNTER — Encounter (HOSPITAL_COMMUNITY): Payer: Self-pay

## 2018-12-25 ENCOUNTER — Ambulatory Visit (HOSPITAL_COMMUNITY)
Admission: RE | Admit: 2018-12-25 | Discharge: 2018-12-25 | Disposition: A | Discharge status: Home / Self Care | Payer: Managed Care, Other (non HMO) | Attending: Neurological Surgery | Admitting: Neurological Surgery

## 2018-12-25 DIAGNOSIS — Z5309 Procedure and treatment not carried out because of other contraindication: Secondary | ICD-10-CM | POA: Diagnosis not present

## 2018-12-25 DIAGNOSIS — M7989 Other specified soft tissue disorders: Secondary | ICD-10-CM | POA: Diagnosis not present

## 2018-12-25 DIAGNOSIS — M5126 Other intervertebral disc displacement, lumbar region: Secondary | ICD-10-CM | POA: Insufficient documentation

## 2018-12-25 SURGERY — CANCELLED PROCEDURE
Anesthesia: General | Site: Back | Laterality: Left

## 2018-12-25 MED ORDER — CHLORHEXIDINE GLUCONATE CLOTH 2 % EX PADS: 6.0000 | MEDICATED_PAD | Freq: Once | CUTANEOUS | Status: DC

## 2018-12-25 MED ORDER — FENTANYL CITRATE (PF) 250 MCG/5ML IJ SOLN
INTRAMUSCULAR | Status: AC
  Filled 2018-12-25: qty 5

## 2018-12-25 MED ORDER — PROPOFOL 10 MG/ML IV BOLUS
INTRAVENOUS | Status: AC
  Filled 2018-12-25: qty 20

## 2018-12-25 MED ORDER — DEXAMETHASONE SODIUM PHOSPHATE 10 MG/ML IJ SOLN
10.0000 mg | INTRAMUSCULAR | Status: DC
  Filled 2018-12-25: qty 1

## 2018-12-25 MED ORDER — CEFAZOLIN SODIUM-DEXTROSE 2-4 GM/100ML-% IV SOLN
2.0000 g | INTRAVENOUS | Status: DC
  Filled 2018-12-25: qty 100

## 2018-12-25 MED ORDER — BUPIVACAINE HCL (PF) 0.25 % IJ SOLN
INTRAMUSCULAR | Status: AC
  Filled 2018-12-25: qty 30

## 2018-12-25 MED ORDER — LACTATED RINGERS IV SOLN: INTRAVENOUS | Status: DC

## 2018-12-25 MED ORDER — THROMBIN 5000 UNITS EX SOLR
CUTANEOUS | Status: AC
  Filled 2018-12-25: qty 15000

## 2018-12-25 SURGICAL SUPPLY — 43 items
BAG DECANTER FOR FLEXI CONT (MISCELLANEOUS) ×3 IMPLANT
BENZOIN TINCTURE PRP APPL 2/3 (GAUZE/BANDAGES/DRESSINGS) ×3 IMPLANT
BUR MATCHSTICK NEURO 3.0 LAGG (BURR) ×3 IMPLANT
CANISTER SUCT 3000ML PPV (MISCELLANEOUS) ×3 IMPLANT
CARTRIDGE OIL MAESTRO DRILL (MISCELLANEOUS) ×1 IMPLANT
CLOSURE WOUND 1/2 X4 (GAUZE/BANDAGES/DRESSINGS) ×1
COVER WAND RF STERILE (DRAPES) ×3 IMPLANT
DIFFUSER DRILL AIR PNEUMATIC (MISCELLANEOUS) ×3 IMPLANT
DRAPE LAPAROTOMY 100X72X124 (DRAPES) ×3 IMPLANT
DRAPE MICROSCOPE LEICA (MISCELLANEOUS) ×3 IMPLANT
DRAPE POUCH INSTRU U-SHP 10X18 (DRAPES) ×3 IMPLANT
DRAPE SURG 17X23 STRL (DRAPES) ×3 IMPLANT
DURAPREP 26ML APPLICATOR (WOUND CARE) ×3 IMPLANT
ELECT REM PT RETURN 9FT ADLT (ELECTROSURGICAL) ×3
ELECTRODE REM PT RTRN 9FT ADLT (ELECTROSURGICAL) ×1 IMPLANT
GAUZE 4X4 16PLY RFD (DISPOSABLE) IMPLANT
GLOVE BIO SURGEON STRL SZ7 (GLOVE) IMPLANT
GLOVE BIO SURGEON STRL SZ8 (GLOVE) ×3 IMPLANT
GLOVE BIOGEL PI IND STRL 7.0 (GLOVE) IMPLANT
GLOVE BIOGEL PI INDICATOR 7.0 (GLOVE)
GOWN STRL REUS W/ TWL LRG LVL3 (GOWN DISPOSABLE) IMPLANT
GOWN STRL REUS W/ TWL XL LVL3 (GOWN DISPOSABLE) ×1 IMPLANT
GOWN STRL REUS W/TWL 2XL LVL3 (GOWN DISPOSABLE) IMPLANT
GOWN STRL REUS W/TWL LRG LVL3 (GOWN DISPOSABLE)
GOWN STRL REUS W/TWL XL LVL3 (GOWN DISPOSABLE) ×2
KIT BASIN OR (CUSTOM PROCEDURE TRAY) ×3 IMPLANT
KIT TURNOVER KIT B (KITS) ×3 IMPLANT
NEEDLE HYPO 25X1 1.5 SAFETY (NEEDLE) ×3 IMPLANT
NEEDLE SPNL 20GX3.5 QUINCKE YW (NEEDLE) IMPLANT
NS IRRIG 1000ML POUR BTL (IV SOLUTION) ×3 IMPLANT
OIL CARTRIDGE MAESTRO DRILL (MISCELLANEOUS) ×3
PACK LAMINECTOMY NEURO (CUSTOM PROCEDURE TRAY) ×3 IMPLANT
PAD ARMBOARD 7.5X6 YLW CONV (MISCELLANEOUS) ×9 IMPLANT
RUBBERBAND STERILE (MISCELLANEOUS) ×6 IMPLANT
SPONGE SURGIFOAM ABS GEL SZ50 (HEMOSTASIS) IMPLANT
STRIP CLOSURE SKIN 1/2X4 (GAUZE/BANDAGES/DRESSINGS) ×2 IMPLANT
SUT VIC AB 0 CT1 18XCR BRD8 (SUTURE) ×1 IMPLANT
SUT VIC AB 0 CT1 8-18 (SUTURE) ×2
SUT VIC AB 2-0 CP2 18 (SUTURE) ×3 IMPLANT
SUT VIC AB 3-0 SH 8-18 (SUTURE) ×3 IMPLANT
TOWEL GREEN STERILE (TOWEL DISPOSABLE) ×3 IMPLANT
TOWEL GREEN STERILE FF (TOWEL DISPOSABLE) ×3 IMPLANT
WATER STERILE IRR 1000ML POUR (IV SOLUTION) ×3 IMPLANT

## 2018-12-25 NOTE — H&P (Signed)
Pt's surgery is cancelled, his radicular pain has mostly resolved, and his main complaint is his lower L leg which is swollen and red. Has seen a vasc doc and had U/S and told he has venous insuff and giiven compression sock. No fever. Leg is swollen with pitting edema, skin is shiny red,  And had Dr Prince Solian look at his leg. rec I wk course keflex and f/u with vascular. Will see pt in office in 3 wks

## 2018-12-25 NOTE — Progress Notes (Signed)
Patient surgery cancelled as his left leg is swollen and red.  Dr. Ronnald Ramp believes it may be cellulitis.  Patient informed and understanding, IV removed, waiting on patient's transportation and he will be discharged

## 2019-01-16 ENCOUNTER — Other Ambulatory Visit: Payer: Self-pay

## 2019-01-16 DIAGNOSIS — I872 Venous insufficiency (chronic) (peripheral): Secondary | ICD-10-CM

## 2019-01-22 ENCOUNTER — Encounter: Payer: Self-pay | Admitting: Vascular Surgery

## 2019-01-22 ENCOUNTER — Other Ambulatory Visit: Payer: Self-pay

## 2019-01-22 ENCOUNTER — Ambulatory Visit (INDEPENDENT_AMBULATORY_CARE_PROVIDER_SITE_OTHER): Payer: Managed Care, Other (non HMO) | Admitting: Vascular Surgery

## 2019-01-22 ENCOUNTER — Encounter: Payer: Managed Care, Other (non HMO) | Admitting: Vascular Surgery

## 2019-01-22 ENCOUNTER — Ambulatory Visit (HOSPITAL_COMMUNITY)
Admission: RE | Admit: 2019-01-22 | Discharge: 2019-01-22 | Disposition: A | Discharge status: Home / Self Care | Payer: Managed Care, Other (non HMO) | Source: Ambulatory Visit | Attending: Vascular Surgery | Admitting: Vascular Surgery

## 2019-01-22 VITALS — BP 121/78 | HR 60 | Temp 98.2°F | Resp 16 | Ht 68.0 in | Wt 135.0 lb

## 2019-01-22 DIAGNOSIS — I872 Venous insufficiency (chronic) (peripheral): Secondary | ICD-10-CM | POA: Insufficient documentation

## 2019-01-22 NOTE — Progress Notes (Signed)
REASON FOR CONSULT:    Chronic venous insufficiency.  The consult is for a second opinion.  The consult is requested by Dr. Antony Contras.  ASSESSMENT & PLAN:   CHRONIC VENOUS INSUFFICIENCY: Based on his physical exam and noninvasive studies I think he does have evidence of chronic venous insufficiency that likely explains his left leg swelling.  He does have reflux in the left great saphenous vein from the saphenofemoral junction down to the calf.  However the vein is not especially dilated.  He does have some reflux in the deep system on the left involving the common femoral vein only.  Based on his exam I think he may have an underlying component of lymphedema he has had previous inguinal hernia repair.  I did look at his previous CT scan which was not a dedicated CT venogram I did not see any obvious compression of the left common iliac vein to suggest a may Thurner syndrome.  Thus based on the information I have so far we have simply discussed conservative measures.  I have encouraged him to elevate his legs daily and I discussed the most effective position for this.  He has problems with the tight compression stockings and I have written him a prescription for a mild compression stocking, knee-high.  I have also encouraged him to avoid long sitting and standing.  We discussed the importance of exercise specifically walking and water aerobics.  If his symptoms progressed then I would recommend obtaining a CT venogram of the abdomen and pelvis to fully assess for left iliac vein compression.  However I would expect that the swelling would be not limited to the calf if this were the case.  Hopefully these conservative measures will help.  If not I will be happy to see him back after we get a CT venogram.   Deitra Mayo, MD, FACS Beeper [phone] Office: [phone]   HPI:   Johnathan [name] is a pleasant 73 y.o. male, who is referred for evaluation of chronic venous insufficiency.  I have  reviewed the records from the referring office.  Of note, the patient was seen by [place] [facility] previously on 12/01/2018.  The patient had bilateral lower extremity swelling which was felt to be multifactorial.  The symptoms were more significant on the left side.  The patient had evidence of superficial venous insufficiency.  Noninvasive studies there showed significant reflux in the great saphenous vein from the saphenofemoral junction to the ankle level bilaterally.  There is no evidence of DVT on either side.  On my history the patient describes significant swelling in the left leg since mid November.  He has had some back issues and sciatica and was actually scheduled for back surgery but it was canceled because of concerns for some redness and swelling in the left calf.  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  He is not really been elevating his legs too much.  He has tried some compression stockings that were real tight and he states he could not tolerate these as it was painful.  The swelling is mostly in the left leg.  He experiences significant aching and heaviness with standing which is relieved somewhat with elevation.  He does not take ibuprofen because he has reflux and was told he should not take this.  He is unaware of any previous history of DVT or phlebitis.  He has had previous bilateral inguinal hernia repairs.  He did have a CT scan in December and I looked at this film and did not see any obvious evidence of may Thurner syndrome although this was not a dedicated CT venogram.  Past Medical History:  Diagnosis Date  . Anemia   . Bradycardia   . GERD (gastroesophageal reflux disease)   . H/O: GI bleed   . Herniated disc, cervical   . Hiatal hernia   . Hyperlipidemia   . Metacarpal bone fracture   . Presence of permanent cardiac pacemaker 04/16/2017  . Rosacea, acne   . SSS (sick  sinus syndrome) ([facility])     Family History  Problem Relation Age of Onset  . Breast cancer Mother   . Diabetes Father   . Heart disease Father        No details  . Diabetes Paternal Grandmother     SOCIAL HISTORY: Social History   Socioeconomic History  . Marital status: Single    Spouse name: Not on file  . Number of children: Not on file  . Years of education: Not on file  . Highest education level: Not on file  Occupational History  . Not on file  Social Needs  . Financial resource strain: Not on file  . Food insecurity:    Worry: Not on file    Inability: Not on file  . Transportation needs:    Medical: Not on file    Non-medical: Not on file  Tobacco Use  . Smoking status: Never Smoker  . Smokeless tobacco: Never Used  Substance and Sexual Activity  . Alcohol use: Yes    Alcohol/week: 0.0 standard drinks    Comment: occasional  . Drug use: No  . Sexual activity: Not on file  Lifestyle  . Physical activity:    Days per week: Not on file    Minutes per session: Not on file  . Stress: Not on file  Relationships  . Social connections:    Talks on phone: Not on file    Gets together: Not on file    Attends religious service: Not on file    Active member of club or organization: Not on file    Attends meetings of clubs or organizations: Not on file    Relationship status: Not on file  . Intimate partner violence:    Fear of current or ex partner: Not on file    Emotionally abused: Not on file    Physically abused: Not on file    Forced sexual activity: Not on file  Other Topics Concern  . Not on file  Social History Narrative   Lives alone.  Cook at [employer].     No Known Allergies  Current Outpatient Medications  Medication Sig Dispense Refill  . Ascorbic Acid (VITAMIN C) 1000 MG tablet Take 1,000 mg by mouth daily.    Marland Kitchen azelastine (ASTELIN) 0.1 % nasal spray Place 1 spray into both nostrils daily as needed for rhinitis.     .  Iron-FA-B Cmp-C-Biot-Probiotic (FUSION PLUS) CAPS Take 1 tablet by mouth daily.   1  . Multiple Vitamins-Minerals (MULTIVITAMIN PO) Take 1 tablet by mouth daily.    Marland Kitchen omeprazole (PRILOSEC) 20 MG capsule Take 40 mg by mouth daily.     Marland Kitchen OVER THE COUNTER MEDICATION Take 1 tablet by mouth daily. Herbal Laxative    . OVER THE COUNTER MEDICATION Take 1 tablet by mouth daily. Vein Formula    .  Turmeric 500 MG CAPS Take 1,000 mg by mouth daily.    Marland Kitchen zinc gluconate 50 MG tablet Take 50 mg by mouth daily.     No current facility-administered medications for this visit.     REVIEW OF SYSTEMS:  [X]  denotes positive finding, [ ]  denotes negative finding Cardiac  Comments:  Chest pain or chest pressure:    Shortness of breath upon exertion:    Short of breath when lying flat:    Irregular heart rhythm:        Vascular    Pain in calf, thigh, or hip brought on by ambulation:    Pain in feet at night that wakes you up from your sleep:     Blood clot in your veins:    Leg swelling:  x       Pulmonary    Oxygen at home:    Productive cough:     Wheezing:         Neurologic    Sudden weakness in arms or legs:     Sudden numbness in arms or legs:     Sudden onset of difficulty speaking or slurred speech:    Temporary loss of vision in one eye:     Problems with dizziness:         Gastrointestinal    Blood in stool:     Vomited blood:         Genitourinary    Burning when urinating:     Blood in urine:        Psychiatric    Major depression:         Hematologic    Bleeding problems:    Problems with blood clotting too easily:        Skin    Rashes or ulcers:        Constitutional    Fever or chills:     PHYSICAL EXAM:   Vitals:   01/22/19 1548  BP: 121/78  Pulse: 60  Resp: 16  Temp: 98.2 F (36.8 C)  TempSrc: Oral  SpO2: 97%  Weight: 135 lb (61.2 kg)  Height: 5\' 8"  (1.727 m)    GENERAL: The patient is a well-nourished male, in no acute distress. The vital signs  are documented above. CARDIAC: There is a regular rate and rhythm.  VASCULAR: I do not detect carotid bruits. He has palpable femoral, popliteal, and dorsalis pedis pulses bilaterally.  I could not palpate posterior tibial pulses however he had monophasic fairly brisk posterior tibial signals with the Doppler. VENOUS EXAM: He does have some spider veins in the anterior and posterior legs.  He also has a large cluster of reticular veins in his posterior right leg. I looked at both great saphenous veins myself with the SonoSite and although he has reflux in the saphenous veins the veins are not dilated and are fairly small. PULMONARY: There is good air exchange bilaterally without wheezing or rales. ABDOMEN: Soft and non-tender with normal pitched bowel sounds.  I do not palpate an abdominal aortic aneurysm. MUSCULOSKELETAL: There are no major deformities or cyanosis. NEUROLOGIC: No focal weakness or paresthesias are detected. SKIN: There are no ulcers or rashes noted. PSYCHIATRIC: The patient has a normal affect.  DATA:    VENOUS REFLUX STUDY: I have independently interpreted the venous reflux study today.  On the right side there is no evidence of deep venous thrombosis or superficial thrombophlebitis.  There is deep venous reflux involving the common femoral vein.  There was reflux in the right great saphenous vein from the saphenofemoral junction to the calf.  The largest diameter in the proximal thigh on the right is 0.43 cm.  On the left side there is no evidence of DVT or superficial thrombophlebitis.  There is deep venous reflux involving the common femoral vein.  There is reflux in the left great saphenous vein from the saphenofemoral junction to the calf.  The largest diameter that we obtained was 0.43 cm  A total of 45 minutes was spent on this visit. 25 minutes was face to face time. More than 50% of the time was spent on counseling and coordinating with the patient.

## 2019-01-27 ENCOUNTER — Ambulatory Visit (INDEPENDENT_AMBULATORY_CARE_PROVIDER_SITE_OTHER): Payer: Managed Care, Other (non HMO) | Admitting: *Deleted

## 2019-01-27 DIAGNOSIS — I495 Sick sinus syndrome: Secondary | ICD-10-CM

## 2019-01-27 LAB — CUP PACEART REMOTE DEVICE CHECK
Battery Remaining Longevity: 114 mo
Battery Remaining Percentage: 95.5 %
Battery Voltage: 2.99 V
Brady Statistic AP VS Percent: 73 %
Brady Statistic AS VP Percent: 1 %
Brady Statistic AS VS Percent: 19 %
Brady Statistic RA Percent Paced: 80 %
Brady Statistic RV Percent Paced: 8.7 %
Date Time Interrogation Session: 20200303065025
Implantable Lead Implant Date: 20180522
Implantable Lead Implant Date: 20180522
Implantable Lead Location: 753859
Implantable Lead Location: 753860
Implantable Pulse Generator Implant Date: 20180522
Lead Channel Impedance Value: 460 Ohm
Lead Channel Impedance Value: 490 Ohm
Lead Channel Pacing Threshold Amplitude: 0.5 V
Lead Channel Pacing Threshold Amplitude: 0.75 V
Lead Channel Pacing Threshold Pulse Width: 0.5 ms
Lead Channel Pacing Threshold Pulse Width: 0.5 ms
Lead Channel Sensing Intrinsic Amplitude: 1.9 mV
Lead Channel Sensing Intrinsic Amplitude: 8.8 mV
Lead Channel Setting Pacing Amplitude: 1.75 V
Lead Channel Setting Pacing Amplitude: 2.5 V
Lead Channel Setting Pacing Pulse Width: 0.5 ms
Lead Channel Setting Sensing Sensitivity: 2 mV
MDC IDC STAT BRADY AP VP PERCENT: 8.7 %
Pulse Gen Model: 2272
Pulse Gen Serial Number: 8908368

## 2019-01-28 ENCOUNTER — Other Ambulatory Visit (HOSPITAL_COMMUNITY): Payer: Self-pay | Admitting: Neurological Surgery

## 2019-01-28 DIAGNOSIS — M5126 Other intervertebral disc displacement, lumbar region: Secondary | ICD-10-CM

## 2019-02-04 ENCOUNTER — Encounter: Payer: Self-pay | Admitting: Cardiology

## 2019-02-04 NOTE — Progress Notes (Signed)
Remote pacemaker transmission.   

## 2019-02-09 ENCOUNTER — Ambulatory Visit (HOSPITAL_COMMUNITY): Admission: RE | Admit: 2019-02-09 | Payer: Managed Care, Other (non HMO) | Source: Ambulatory Visit

## 2019-02-13 ENCOUNTER — Encounter: Payer: Self-pay | Admitting: *Deleted

## 2019-02-23 ENCOUNTER — Telehealth: Payer: Self-pay | Admitting: *Deleted

## 2019-02-23 ENCOUNTER — Ambulatory Visit (HOSPITAL_COMMUNITY): Payer: Managed Care, Other (non HMO)

## 2019-02-23 ENCOUNTER — Encounter (HOSPITAL_COMMUNITY): Payer: Self-pay

## 2019-02-23 NOTE — Telephone Encounter (Signed)
Called patient to let them know due to recent [place] and Health Department Protocols, we are not seeing patients in the office on Tuesday in [place]. We are instead seeing if they would like to schedule this appointment as a Research scientist (medical) or Laptop. Patient is aware if they decide to reschedule this appointment, they may not be seen or scheduled for the next 4-6 months. Patient does not have access to WebEx Virtual Visits because his laptop does not have a camera and he does not have a smart phone.

## 2019-02-24 ENCOUNTER — Encounter: Payer: Managed Care, Other (non HMO) | Admitting: Cardiology

## 2019-04-13 ENCOUNTER — Other Ambulatory Visit: Payer: Self-pay

## 2019-04-13 ENCOUNTER — Ambulatory Visit (HOSPITAL_COMMUNITY)
Admission: RE | Admit: 2019-04-13 | Discharge: 2019-04-13 | Disposition: A | Discharge status: Home / Self Care | Payer: Managed Care, Other (non HMO) | Source: Ambulatory Visit | Attending: Neurological Surgery | Admitting: Neurological Surgery

## 2019-04-13 DIAGNOSIS — M5126 Other intervertebral disc displacement, lumbar region: Secondary | ICD-10-CM | POA: Insufficient documentation

## 2019-04-22 ENCOUNTER — Ambulatory Visit: Payer: Managed Care, Other (non HMO) | Admitting: Vascular Surgery

## 2019-04-22 ENCOUNTER — Other Ambulatory Visit: Payer: Self-pay

## 2019-04-22 ENCOUNTER — Encounter: Payer: Self-pay | Admitting: Vascular Surgery

## 2019-04-22 VITALS — BP 110/73 | HR 59 | Temp 98.0°F | Resp 20 | Ht 68.0 in | Wt 135.0 lb

## 2019-04-22 DIAGNOSIS — I872 Venous insufficiency (chronic) (peripheral): Secondary | ICD-10-CM | POA: Diagnosis not present

## 2019-04-22 NOTE — Progress Notes (Signed)
Patient name: Johnathan [name] MRN: [id number] DOB: 1946/06/03 Sex: male  REASON FOR VISIT:   Chronic venous insufficiency follow-up.  HPI:   Johnathan [name] is a pleasant 73 y.o. male who I last saw on 01/22/2019.  I was seeing him as a second opinion with chronic venous insufficiency.  Based on his physical exam and noninvasive studies at that time I felt that he had evidence of chronic venous insufficiency that explains his left leg swelling.  I also felt that he may have an underlying component of lymphedema as he has had a previous inguinal hernia repair.  We discussed conservative measures at that time.  I felt that if his symptoms progressed then we could consider obtaining a CT venogram of the abdomen and pelvis to fully assess for left iliac vein compression.  Since I saw him last Dr. Aleda Grana got him a compression system which has helped significantly with his leg swelling.  He is unable to tolerate compression stockings.  He is unable to elevate his legs because of his reflux so I think this is really a good solution for him.  His main complaint today is stiffness in his knees and his ankles.  He states that his swelling has improved significantly.  He denies any claudication or rest pain.  Current Outpatient Medications  Medication Sig Dispense Refill  . Ascorbic Acid (VITAMIN C) 1000 MG tablet Take 1,000 mg by mouth daily.    Marland Kitchen azelastine (ASTELIN) 0.1 % nasal spray Place 1 spray into both nostrils daily as needed for rhinitis.     . Iron-FA-B Cmp-C-Biot-Probiotic (FUSION PLUS) CAPS Take 1 tablet by mouth daily.   1  . Multiple Vitamins-Minerals (MULTIVITAMIN PO) Take 1 tablet by mouth daily.    Marland Kitchen omeprazole (PRILOSEC) 20 MG capsule Take 40 mg by mouth daily.     Marland Kitchen OVER THE COUNTER MEDICATION Take 1 tablet by mouth daily. Herbal Laxative    . zinc gluconate 50 MG tablet Take 50 mg by mouth daily.     No current facility-administered medications for this visit.      REVIEW OF SYSTEMS:  [X]  denotes positive finding, [ ]  denotes negative finding Vascular    Leg swelling    Cardiac    Chest pain or chest pressure:    Shortness of breath upon exertion:    Short of breath when lying flat:    Irregular heart rhythm:    Constitutional    Fever or chills:     PHYSICAL EXAM:   Vitals:   04/22/19 0957  BP: 110/73  Pulse: (!) 59  Resp: 20  Temp: 98 F (36.7 C)  SpO2: 95%  Weight: 135 lb (61.2 kg)  Height: 5\' 8"  (1.727 m)    GENERAL: The patient is a well-nourished male, in no acute distress. The vital signs are documented above. CARDIOVASCULAR: There is a regular rate and rhythm. PULMONARY: There is good air exchange bilaterally without wheezing or rales. VASCULAR: He has palpable femoral and dorsalis pedis pulses bilaterally. His leg swelling has significantly improved.  DATA:   No new data.  MEDICAL ISSUES:   CHRONIC VENOUS INSUFFICIENCY: The patient has had significant improvement in his leg swelling since he started using the pneumatic compression system.  This is really his best option given that he does not tolerate compression stockings and he cannot elevate his legs because he cannot keep his back flat secondary to reflux.  He is being considered for laser ablation of the left  great saphenous vein for reflux and I think this is reasonable.  I explained that I think this could help with the swelling but will not necessarily help with the stiffness.  I suspect this is more related to arthritis.  Deitra Mayo [facility] [phone]

## 2019-04-28 ENCOUNTER — Ambulatory Visit (INDEPENDENT_AMBULATORY_CARE_PROVIDER_SITE_OTHER): Payer: Managed Care, Other (non HMO) | Admitting: *Deleted

## 2019-04-28 DIAGNOSIS — I495 Sick sinus syndrome: Secondary | ICD-10-CM | POA: Diagnosis not present

## 2019-04-28 LAB — CUP PACEART REMOTE DEVICE CHECK
Battery Remaining Longevity: 116 mo
Battery Remaining Percentage: 95.5 %
Battery Voltage: 3.01 V
Brady Statistic AP VP Percent: 4.7 %
Brady Statistic AP VS Percent: 90 %
Brady Statistic AS VP Percent: 1 %
Brady Statistic AS VS Percent: 5.7 %
Brady Statistic RA Percent Paced: 94 %
Brady Statistic RV Percent Paced: 4.7 %
Date Time Interrogation Session: 20200602040422
Implantable Lead Implant Date: 20180522
Implantable Lead Implant Date: 20180522
Implantable Lead Location: 753859
Implantable Lead Location: 753860
Implantable Pulse Generator Implant Date: 20180522
Lead Channel Impedance Value: 510 Ohm
Lead Channel Impedance Value: 530 Ohm
Lead Channel Pacing Threshold Amplitude: 0.5 V
Lead Channel Pacing Threshold Amplitude: 0.75 V
Lead Channel Pacing Threshold Pulse Width: 0.5 ms
Lead Channel Pacing Threshold Pulse Width: 0.5 ms
Lead Channel Sensing Intrinsic Amplitude: 2.5 mV
Lead Channel Sensing Intrinsic Amplitude: 9.9 mV
Lead Channel Setting Pacing Amplitude: 1.75 V
Lead Channel Setting Pacing Amplitude: 2.5 V
Lead Channel Setting Pacing Pulse Width: 0.5 ms
Lead Channel Setting Sensing Sensitivity: 2 mV
Pulse Gen Model: 2272
Pulse Gen Serial Number: 8908368

## 2019-05-06 ENCOUNTER — Encounter: Payer: Self-pay | Admitting: Cardiology

## 2019-05-06 NOTE — Progress Notes (Signed)
Remote pacemaker transmission.   

## 2019-05-22 ENCOUNTER — Other Ambulatory Visit: Payer: Self-pay | Admitting: *Deleted

## 2019-05-22 DIAGNOSIS — I83812 Varicose veins of left lower extremities with pain: Secondary | ICD-10-CM

## 2019-05-27 ENCOUNTER — Telehealth (HOSPITAL_COMMUNITY): Payer: Self-pay | Admitting: Rehabilitation

## 2019-05-27 NOTE — Telephone Encounter (Signed)

## 2019-05-28 ENCOUNTER — Ambulatory Visit (INDEPENDENT_AMBULATORY_CARE_PROVIDER_SITE_OTHER): Payer: Managed Care, Other (non HMO) | Admitting: Vascular Surgery

## 2019-05-28 ENCOUNTER — Encounter: Payer: Self-pay | Admitting: Vascular Surgery

## 2019-05-28 ENCOUNTER — Other Ambulatory Visit: Payer: Self-pay

## 2019-05-28 VITALS — BP 103/66 | HR 62 | Temp 98.8°F | Resp 18 | Ht 68.0 in | Wt 132.0 lb

## 2019-05-28 DIAGNOSIS — I872 Venous insufficiency (chronic) (peripheral): Secondary | ICD-10-CM

## 2019-05-28 DIAGNOSIS — I83812 Varicose veins of left lower extremities with pain: Secondary | ICD-10-CM | POA: Diagnosis not present

## 2019-05-28 HISTORY — PX: ENDOVENOUS ABLATION SAPHENOUS VEIN W/ LASER: SUR449

## 2019-05-28 NOTE — Progress Notes (Signed)
Patient name: Johnathan [name] MRN: [id number] DOB: 30-May-1946 Sex: male  REASON FOR VISIT:   For laser ablation left great saphenous vein  HPI:   Johnathan [name] is a pleasant 73 y.o. male with a history of chronic venous insufficiency and left leg swelling.  He has had persistent swelling despite using a pneumatic compression system which has helped.  He had significant reflux in the left great saphenous vein was felt to be a good candidate for laser ablation of the left great saphenous vein.  Current Outpatient Medications  Medication Sig Dispense Refill  . Ascorbic Acid (VITAMIN C) 1000 MG tablet Take 1,000 mg by mouth daily.    Marland Kitchen azelastine (ASTELIN) 0.1 % nasal spray Place 1 spray into both nostrils daily as needed for rhinitis.     . Iron-FA-B Cmp-C-Biot-Probiotic (FUSION PLUS) CAPS Take 1 tablet by mouth daily.   1  . Multiple Vitamins-Minerals (MULTIVITAMIN PO) Take 1 tablet by mouth daily.    Marland Kitchen omeprazole (PRILOSEC) 20 MG capsule Take 40 mg by mouth daily.     Marland Kitchen OVER THE COUNTER MEDICATION Take 1 tablet by mouth daily. Herbal Laxative    . zinc gluconate 50 MG tablet Take 50 mg by mouth daily.     No current facility-administered medications for this visit.     REVIEW OF SYSTEMS:  [X]  denotes positive finding, [ ]  denotes negative finding Vascular    Leg swelling    Cardiac    Chest pain or chest pressure:    Shortness of breath upon exertion:    Short of breath when lying flat:    Irregular heart rhythm:    Constitutional    Fever or chills:     PHYSICAL EXAM:   Vitals:   05/28/19 1031  BP: 103/66  Pulse: 62  Resp: 18  Temp: 98.8 F (37.1 C)  TempSrc: Other (Comment)  SpO2: 98%  Weight: 132 lb (59.9 kg)  Height: 5\' 8"  (1.727 m)    GENERAL: The patient is a well-nourished male, in no acute distress. The vital signs are documented above.   DATA:   No new data  MEDICAL ISSUES:   LASER ABLATION LEFT GREAT SAPHENOUS VEIN: The patient was taken  to the exam room and placed supine.  I examined the great saphenous vein with the SonoSite.  In the distal thigh it became bifid.  My initial thought was to cannulate the larger branch in the distal thigh.  The left leg was prepped and draped in usual sterile fashion.  Under ultrasound guidance, after the skin was anesthetized, I cannulated the great saphenous vein with a micropuncture needle multiple times but was not able to successfully thread the wire.  It seemed that the vein spasmed each time we attempted to pass the wire.  I slowly moved up the vein and ultimately was able to cannulate the great saphenous vein in the proximal thigh.  Micropuncture sheath was introduced over the wire and then the J-wire advanced up to the saphenofemoral junction.  The sheath was then advanced over the J-wire and the dilator removed.  After the sheath was carefully positioned about 2-1/2 cm below the saphenofemoral junction we then placed the laser at the end of the sheath and the sheath was slightly retracted.  Next tumescent anesthesia was administered the entire length of the proximal left great saphenous vein.  The patient was then placed in Trendelenburg.  Laser ablation was performed of the left great saphenous vein from 2-1/2  cm distal to the saphenofemoral junction to the proximal third of the thigh.  A total of 1012 J of energy were used.  Sterile dressing was applied.  The patient tolerated procedure well.  He will return next week for follow-up duplex.  Deitra Mayo [facility] [phone]

## 2019-05-28 NOTE — Progress Notes (Signed)
     Laser Ablation Procedure    Date: 05/28/2019   Johnathan [name] DOB:[birth year]  Consent signed: Yes    Surgeon:  Dr. Deitra Mayo MD   Procedure: Laser Ablation: left Greater Saphenous Vein  BP 103/66 (BP Location: Left Arm, Patient Position: Sitting, Cuff Size: Normal)   Pulse 62   Temp 98.8 F (37.1 C) (Other (Comment)) Comment (Src): forehead  Resp 18   Ht 5\' 8"  (1.727 m)   Wt 132 lb (59.9 kg)   SpO2 98%   BMI 20.07 kg/m   Tumescent Anesthesia: 225 cc 0.9% NaCl with 50 cc Lidocaine HCL 1% and 15 cc 8.4% NaHCO3  Local Anesthesia: 8 cc Lidocaine HCL and NaHCO3 (ratio 2:1)  15 watts continuous mode        Total energy:1012 Joules    Total time: 1:07    Patient tolerated procedure well  Notes: Johnathan [name] wore facial mask.  All staff wore facial masks and facial shields.    Description of Procedure:  After marking the course of the secondary varicosities, the patient was placed on the operating table in the supine position, and the left leg was prepped and draped in sterile fashion.   Local anesthetic was administered and under ultrasound guidance the saphenous vein was accessed with a micro needle and guide wire; then the mirco puncture sheath was placed.  A guide wire was inserted saphenofemoral junction , followed by a 5 french sheath.  The position of the sheath and then the laser fiber below the junction was confirmed using the ultrasound.  Tumescent anesthesia was administered along the course of the saphenous vein using ultrasound guidance. The patient was placed in Trendelenburg position and protective laser glasses were placed on patient and staff, and the laser was fired at 15 watts continuous mode advancing 1-99mm/second for a total of 1012 joules.    Steri strip applied to the IV insertion site and ABD pads and thigh high compression stockings were applied.  Ace wrap bandages were applied at the top of the saphenofemoral junction. Blood loss was less  than 15 cc.  The patient ambulated out of the operating room having tolerated the procedure well.

## 2019-06-04 ENCOUNTER — Encounter: Payer: Self-pay | Admitting: Vascular Surgery

## 2019-06-04 ENCOUNTER — Other Ambulatory Visit: Payer: Self-pay

## 2019-06-04 ENCOUNTER — Ambulatory Visit (INDEPENDENT_AMBULATORY_CARE_PROVIDER_SITE_OTHER): Payer: Managed Care, Other (non HMO) | Admitting: Vascular Surgery

## 2019-06-04 ENCOUNTER — Ambulatory Visit (HOSPITAL_COMMUNITY)
Admission: RE | Admit: 2019-06-04 | Discharge: 2019-06-04 | Disposition: A | Discharge status: Home / Self Care | Payer: Managed Care, Other (non HMO) | Source: Ambulatory Visit | Attending: Vascular Surgery | Admitting: Vascular Surgery

## 2019-06-04 VITALS — BP 104/68 | HR 72 | Temp 97.9°F | Resp 16 | Ht 68.0 in | Wt 132.0 lb

## 2019-06-04 DIAGNOSIS — I83812 Varicose veins of left lower extremities with pain: Secondary | ICD-10-CM | POA: Diagnosis not present

## 2019-06-04 DIAGNOSIS — I872 Venous insufficiency (chronic) (peripheral): Secondary | ICD-10-CM | POA: Diagnosis not present

## 2019-06-04 NOTE — Progress Notes (Signed)
   Patient name: Johnathan [name] MRN: [id number] DOB: [birth year] Sex: male  REASON FOR VISIT:   Follow-up after laser ablation left great saphenous vein  HPI:   Johnathan [name] is a pleasant 73 y.o. male who presented with painful varicose veins of the left lower extremity.  He had failed conservative treatment and underwent laser ablation of the left great saphenous vein last week.  He returns for a follow-up visit.  His only complaint continues to be stiffness in his ankles and also some right knee pain.  I have again explained that I think these are likely related to arthritis and not venous disease.  He denies significant leg swelling.  He said no chest pain or shortness of breath.  Current Outpatient Medications  Medication Sig Dispense Refill  . Ascorbic Acid (VITAMIN C) 1000 MG tablet Take 1,000 mg by mouth daily.    Marland Kitchen azelastine (ASTELIN) 0.1 % nasal spray Place 1 spray into both nostrils daily as needed for rhinitis.     . Iron-FA-B Cmp-C-Biot-Probiotic (FUSION PLUS) CAPS Take 1 tablet by mouth daily.   1  . Multiple Vitamins-Minerals (MULTIVITAMIN PO) Take 1 tablet by mouth daily.    Marland Kitchen omeprazole (PRILOSEC) 20 MG capsule Take 40 mg by mouth daily.     Marland Kitchen OVER THE COUNTER MEDICATION Take 1 tablet by mouth daily. Herbal Laxative    . zinc gluconate 50 MG tablet Take 50 mg by mouth daily.     No current facility-administered medications for this visit.     REVIEW OF SYSTEMS:  [X]  denotes positive finding, [ ]  denotes negative finding Vascular    Leg swelling    Cardiac    Chest pain or chest pressure:    Shortness of breath upon exertion:    Short of breath when lying flat:    Irregular heart rhythm:    Constitutional    Fever or chills:     PHYSICAL EXAM:   Vitals:   06/04/19 1059  BP: 104/68  Pulse: 72  Resp: 16  Temp: 97.9 F (36.6 C)  TempSrc: Temporal  SpO2: 99%  Weight: 132 lb (59.9 kg)  Height: 5\' 8"  (1.727 m)    GENERAL: The patient is a  well-nourished male, in no acute distress. The vital signs are documented above. CARDIOVASCULAR: There is a regular rate and rhythm. PULMONARY: There is good air exchange bilaterally without wheezing or rales. VASCULAR: He has minimal bruising in the left thigh.  There is no significant left leg swelling.  Both feet are warm and well-perfused.  DATA:   DUPLEX: I have independently interpreted his duplex of the left great saphenous vein.  The vein is successfully closed proximally.  Clot extends slightly into the femoral vein consistent with EHIT-2.  MEDICAL ISSUES:   STATUS POST LASER ABLATION LEFT GREAT SAPHENOUS VEIN: Patient is undergone successful ablation of the left great saphenous vein in the proximal thigh.  Given that the clot extends slightly into the femoral vein I have recommended that he begin taking aspirin for the next 2 weeks.  I also encouraged him to elevate his legs and ambulate as much as possible.  I will see him back as needed.  He does have some spider veins and if he elects to proceed with sclerotherapy then he will call to arrange that in the near future.  Deitra Mayo [facility] [phone]

## 2019-06-27 HISTORY — PX: BACK SURGERY: SHX140

## 2019-07-01 ENCOUNTER — Telehealth: Payer: Self-pay | Admitting: *Deleted

## 2019-07-01 NOTE — Telephone Encounter (Signed)
Returning Mr. Johnathan [name]'s earlier telephone voice message regarding left ankle stiffness.  Mr. Johnathan [name] is s/p endovenous laser ablation left greater saphenous vein by Gae Gallop MD on 05-28-2019.  Mr. Johnathan [name] states his left leg swelling has improved since laser ablation was done but that his left ankle "is still stiff."  Suggested he see an orthopedic doctor to evaluate left ankle stiffness. Mr. Johnathan [name] verbalized understanding.

## 2019-07-02 ENCOUNTER — Other Ambulatory Visit: Payer: Self-pay | Admitting: Neurological Surgery

## 2019-07-11 ENCOUNTER — Other Ambulatory Visit (HOSPITAL_COMMUNITY)
Admission: RE | Admit: 2019-07-11 | Discharge: 2019-07-11 | Disposition: A | Discharge status: Home / Self Care | Payer: Managed Care, Other (non HMO) | Source: Ambulatory Visit | Attending: Neurological Surgery | Admitting: Neurological Surgery

## 2019-07-11 DIAGNOSIS — Z01812 Encounter for preprocedural laboratory examination: Secondary | ICD-10-CM | POA: Diagnosis present

## 2019-07-11 DIAGNOSIS — Z20828 Contact with and (suspected) exposure to other viral communicable diseases: Secondary | ICD-10-CM | POA: Insufficient documentation

## 2019-07-11 LAB — SARS CORONAVIRUS 2 (TAT 6-24 HRS): SARS Coronavirus 2: NEGATIVE

## 2019-07-13 NOTE — Pre-Procedure Instructions (Signed)
Johnathan [name]  07/13/2019      [facility] [place], [place] AT [facility] OF [address] [address] Phone: [phone] Fax: [phone]    Your procedure is scheduled on July 15, 2019.  Report to [facility] Admitting at 630 AM.  Call this number if you have problems the morning of surgery:  [phone]   Remember:  Do not eat or drink after midnight.    Take these medicines the morning of surgery with A SIP OF WATER  Astelin nasal spray-if needed Omeprazole (prilosec)  Beginning now, STOP taking any Aspirin (unless otherwise instructed by your surgeon), Aleve, Naproxen, Ibuprofen, Motrin, Advil, Goody's, BC's, all herbal medications, fish oil, and all vitamins    Day of surgery:  Do not wear jewelry  Do not wear lotions, powders, or colgones, or deodorant.  Men may shave face and neck.  Do not bring valuables to the hospital.  [facility] is not responsible for any belongings or valuables.  Contacts, dentures or bridgework may not be worn into surgery.  Leave your suitcase in the car.  After surgery it may be brought to your room.  For patients admitted to the hospital, discharge time will be determined by your treatment team.  Patients discharged the day of surgery will not be allowed to drive home.   [place]- Preparing For Surgery  Before surgery, you can play an important role. Because skin is not sterile, your skin needs to be as free of germs as possible. You can reduce the number of germs on your skin by washing with CHG (chlorahexidine gluconate) Soap before surgery.  CHG is an antiseptic cleaner which kills germs and bonds with the skin to continue killing germs even after washing.    Oral Hygiene is also important to reduce your risk of infection.  Remember - BRUSH YOUR TEETH THE MORNING OF SURGERY WITH YOUR REGULAR TOOTHPASTE  Please do not use if you have an  allergy to CHG or antibacterial soaps. If your skin becomes reddened/irritated stop using the CHG.  Do not shave (including legs and underarms) for at least 48 hours prior to first CHG shower. It is OK to shave your face.  Please follow these instructions carefully.   1. Shower the NIGHT BEFORE SURGERY and the MORNING OF SURGERY with CHG.   2. If you chose to wash your hair, wash your hair first as usual with your normal shampoo.  3. After you shampoo, rinse your hair and body thoroughly to remove the shampoo.  4. Use CHG as you would any other liquid soap. You can apply CHG directly to the skin and wash gently with a scrungie or a clean washcloth.   5. Apply the CHG Soap to your body ONLY FROM THE NECK DOWN.  Do not use on open wounds or open sores. Avoid contact with your eyes, ears, mouth and genitals (private parts). Wash Face and genitals (private parts)  with your normal soap.  6. Wash thoroughly, paying special attention to the area where your surgery will be performed.  7. Thoroughly rinse your body with warm water from the neck down.  8. DO NOT shower/wash with your normal soap after using and rinsing off the CHG Soap.  9. Pat yourself dry with a CLEAN TOWEL.  10. Wear CLEAN PAJAMAS to bed the night before surgery, wear comfortable clothes the morning of surgery  11. Place CLEAN SHEETS on your bed the night of your first shower and DO NOT SLEEP WITH PETS.  Day of Surgery:   Shower as above Do not apply any deodorants/lotions.  Please wear clean clothes to the hospital/surgery center.   Remember to brush your teeth WITH YOUR REGULAR TOOTHPASTE.  Please read over the following fact sheets that you were given.

## 2019-07-14 ENCOUNTER — Encounter (HOSPITAL_COMMUNITY): Payer: Self-pay

## 2019-07-14 ENCOUNTER — Encounter (HOSPITAL_COMMUNITY)
Admission: RE | Admit: 2019-07-14 | Discharge: 2019-07-14 | Disposition: A | Discharge status: Home / Self Care | Payer: Medicare Other | Source: Ambulatory Visit | Attending: Neurological Surgery | Admitting: Neurological Surgery

## 2019-07-14 ENCOUNTER — Other Ambulatory Visit: Payer: Self-pay

## 2019-07-14 DIAGNOSIS — M4310 Spondylolisthesis, site unspecified: Secondary | ICD-10-CM

## 2019-07-14 DIAGNOSIS — Z01818 Encounter for other preprocedural examination: Secondary | ICD-10-CM | POA: Insufficient documentation

## 2019-07-14 HISTORY — DX: Unspecified osteoarthritis, unspecified site: M19.90

## 2019-07-14 HISTORY — DX: Achalasia of cardia: K22.0

## 2019-07-14 LAB — BASIC METABOLIC PANEL
Anion gap: 11 (ref 5–15)
BUN: 9 mg/dL (ref 8–23)
CO2: 25 mmol/L (ref 22–32)
Calcium: 9.9 mg/dL (ref 8.9–10.3)
Chloride: 102 mmol/L (ref 98–111)
Creatinine, Ser: 0.76 mg/dL (ref 0.61–1.24)
GFR calc Af Amer: >60 mL/min (ref >60)
GFR calc non Af Amer: >60 mL/min (ref >60)
Glucose, Bld: 117 mg/dL — ABNORMAL HIGH (ref 70–99)
Potassium: 3.8 mmol/L (ref 3.5–5.1)
Sodium: 138 mmol/L (ref 135–145)

## 2019-07-14 LAB — TYPE AND SCREEN
ABO/RH(D): A POS
Antibody Screen: NEGATIVE

## 2019-07-14 LAB — CBC WITH DIFFERENTIAL/PLATELET
Abs Immature Granulocytes: 0.04 10*3/uL (ref 0.00–0.07)
Basophils Absolute: 0.1 10*3/uL (ref 0.0–0.1)
Basophils Relative: 1 %
Eosinophils Absolute: 0.1 10*3/uL (ref 0.0–0.5)
Eosinophils Relative: 1 %
HCT: 43.6 % (ref 39.0–52.0)
Hemoglobin: 14.3 g/dL (ref 13.0–17.0)
Immature Granulocytes: 1 %
Lymphocytes Relative: 8 %
Lymphs Abs: 0.6 10*3/uL — ABNORMAL LOW (ref 0.7–4.0)
MCH: 32.1 pg (ref 26.0–34.0)
MCHC: 32.8 g/dL (ref 30.0–36.0)
MCV: 98 fL (ref 80.0–100.0)
Monocytes Absolute: 0.6 10*3/uL (ref 0.1–1.0)
Monocytes Relative: 8 %
Neutro Abs: 6 10*3/uL (ref 1.7–7.7)
Neutrophils Relative %: 81 %
Platelets: 308 10*3/uL (ref 150–400)
RBC: 4.45 MIL/uL (ref 4.22–5.81)
RDW: 13.1 % (ref 11.5–15.5)
WBC: 7.4 10*3/uL (ref 4.0–10.5)
nRBC: 0 % (ref 0.0–0.2)

## 2019-07-14 LAB — SURGICAL PCR SCREEN
MRSA, PCR: NEGATIVE
Staphylococcus aureus: NEGATIVE

## 2019-07-14 LAB — PROTIME-INR
INR: 1 (ref 0.8–1.2)
Prothrombin Time: 13 seconds (ref 11.4–15.2)

## 2019-07-14 NOTE — Anesthesia Preprocedure Evaluation (Addendum)
Anesthesia Evaluation  Patient identified by MRN, date of birth, ID band Patient awake    Reviewed: Allergy & Precautions, NPO status , Patient's Chart, lab work & pertinent test results  Airway Mallampati: I  TM Distance: >3 FB Neck ROM: Full    Dental no notable dental hx.    Pulmonary neg pulmonary ROS,    Pulmonary exam normal breath sounds clear to auscultation       Cardiovascular Normal cardiovascular exam+ pacemaker  Rhythm:Regular Rate:Normal  ECG: rate 79. Atrial paced  Sees cardiologist ([place])   Neuro/Psych negative neurological ROS  negative psych ROS   GI/Hepatic Neg liver ROS, hiatal hernia, GERD  Medicated and Controlled,  Endo/Other  negative endocrine ROS  Renal/GU negative Renal ROS     Musculoskeletal  (+) Arthritis ,   Abdominal   Peds  Hematology HLD   Anesthesia Other Findings Spondylolisthesis  Reproductive/Obstetrics                           Anesthesia Physical Anesthesia Plan  ASA: III  Anesthesia Plan: General   Post-op Pain Management:    Induction: Intravenous  PONV Risk Score and Plan: 3 and Ondansetron, Dexamethasone, Midazolam and Treatment may vary due to age or medical condition  Airway Management Planned: Oral ETT  Additional Equipment:   Intra-op Plan:   Post-operative Plan: Extubation in OR  Informed Consent: I have reviewed the patients History and Physical, chart, labs and discussed the procedure including the risks, benefits and alternatives for the proposed anesthesia with the patient or authorized representative who has indicated his/her understanding and acceptance.     Dental advisory given  Plan Discussed with: CRNA  Anesthesia Plan Comments: (Pt follows with Dr. Curt Bears for sinus node dysfunction s/p [facility] Jude dual-chamber pacemaker implanted 04/16/17. Device interrogation has revealed some short-lived paroxysmal afib. Last  remote transmission 04/28/19 showed Normal pacemaker function.  AT/AF burden <1%.   He is not anticoagulated at this time, CHA2DS2-VASc sore of 1. He had normal cath in 2016. Normal systolic function by echo 2018.  Pt was previously scheduled for surgery Jan 2020 and at that time had cardiac clearance per telephone encounter by Lyda Jester, PA-C 11/14/2018. Case was cancelled due to resolution of radicular symptoms. Pt also c/o LE swelling at that time. In the interim he has been seen by Dr. Scot Dock for chronic venous insufficiency and underwent laser ablation of the left great saphenous vein.  TTE 03/11/2017: Study Conclusions  - Left ventricle: The cavity size was moderately dilated. Systolic function was normal. Wall motion was normal; there were no regional wall motion abnormalities. Features are consistent with a pseudonormal left ventricular filling pattern, with concomitant abnormal relaxation and increased filling pressure (grade 2 diastolic dysfunction). - Aortic valve: Trileaflet; mildly thickened, mildly calcified leaflets. - Mitral valve: There was mild regurgitation. - Right ventricle: The cavity size was mildly dilated. Wall thickness was normal. - Right atrium: The atrium was mildly dilated. - Tricuspid valve: There was mild regurgitation directed eccentrically. - Pulmonary arteries: PA peak pressure: 42 mm Hg (S). - Impressions: The right ventricular systolic pressure was increased consistent with moderate pulmonary hypertension.  Impressions:  - Compared to study of 2016, PAP have increased from 31mmHg to 65mmHg. The right ventricular systolic pressure was increased consistent with moderate pulmonary hypertension.  Cath 06/20/2015:  The left ventricular systolic function is normal.  Normal LV function with an ejection fraction of 50-55%.  No evidence for  coronary obstructive disease with a normal LAD with evidence for a mid LAD  intramyocardial segment without systolic bridging, a normal circumflex and normal dominant RCA.  RECOMMENDATION:  Medical therapy. Consider possible small vessel disease in the etiology of his size induced ST segment depression. The patient will follow-up with Dr. Percival Spanish. )     Anesthesia Quick Evaluation

## 2019-07-14 NOTE — Progress Notes (Signed)
PCP - [place]  Chest x-ray - 11/2018 EKG - today-07/14/2019 Stress Test -  ECHO -  Cardiac Cath -  05/2015  Pacer order faxed to Dr. Curt Bears  Sleep Study - none CPAP - n/a  Fasting Blood Sugar - n/a Checks Blood Sugar _____ times a day  Blood Thinner Instructions:n/a Aspirin Instructions:denies taking   Anesthesia review: called Val Eagle,  he is reviewing chart relative to surgery is tomorrow- 8/19, Last visit with cardiac was 02/2018, but telephone clearance was given 11/2018 but surgery was canceled due to LE needs with vascular dept.    Patient denies shortness of breath, fever, cough and chest pain at PAT appointment   Patient verbalized understanding of instructions that were given to them at the PAT appointment. Patient was also instructed that they will need to review over the PAT instructions again at home before surgery.

## 2019-07-15 ENCOUNTER — Encounter (HOSPITAL_COMMUNITY): Payer: Self-pay

## 2019-07-15 ENCOUNTER — Inpatient Hospital Stay (HOSPITAL_COMMUNITY)
Admission: RE | Admit: 2019-07-15 | Discharge: 2019-07-16 | DRG: 455 | Disposition: A | Discharge status: Home / Self Care | Payer: Medicare Other | Attending: Neurological Surgery | Admitting: Neurological Surgery

## 2019-07-15 ENCOUNTER — Inpatient Hospital Stay (HOSPITAL_COMMUNITY): Payer: Medicare Other

## 2019-07-15 ENCOUNTER — Inpatient Hospital Stay (HOSPITAL_COMMUNITY): Payer: Medicare Other | Admitting: Anesthesiology

## 2019-07-15 ENCOUNTER — Inpatient Hospital Stay (HOSPITAL_COMMUNITY): Payer: Medicare Other | Admitting: Physician Assistant

## 2019-07-15 ENCOUNTER — Encounter (HOSPITAL_COMMUNITY)
Admission: RE | Disposition: A | Discharge status: Home / Self Care | Payer: Self-pay | Source: Home / Self Care | Attending: Neurological Surgery

## 2019-07-15 ENCOUNTER — Other Ambulatory Visit: Payer: Self-pay

## 2019-07-15 DIAGNOSIS — M48061 Spinal stenosis, lumbar region without neurogenic claudication: Secondary | ICD-10-CM | POA: Diagnosis present

## 2019-07-15 DIAGNOSIS — Z419 Encounter for procedure for purposes other than remedying health state, unspecified: Secondary | ICD-10-CM

## 2019-07-15 DIAGNOSIS — Z950 Presence of cardiac pacemaker: Secondary | ICD-10-CM | POA: Diagnosis not present

## 2019-07-15 DIAGNOSIS — Z79899 Other long term (current) drug therapy: Secondary | ICD-10-CM | POA: Diagnosis not present

## 2019-07-15 DIAGNOSIS — M5116 Intervertebral disc disorders with radiculopathy, lumbar region: Secondary | ICD-10-CM | POA: Diagnosis present

## 2019-07-15 DIAGNOSIS — M4316 Spondylolisthesis, lumbar region: Secondary | ICD-10-CM | POA: Diagnosis present

## 2019-07-15 DIAGNOSIS — E785 Hyperlipidemia, unspecified: Secondary | ICD-10-CM | POA: Diagnosis present

## 2019-07-15 DIAGNOSIS — Z981 Arthrodesis status: Secondary | ICD-10-CM

## 2019-07-15 DIAGNOSIS — K219 Gastro-esophageal reflux disease without esophagitis: Secondary | ICD-10-CM | POA: Diagnosis present

## 2019-07-15 SURGERY — POSTERIOR LUMBAR FUSION 2 LEVEL
Anesthesia: General | Site: Back

## 2019-07-15 MED ORDER — ROCURONIUM BROMIDE 10 MG/ML (PF) SYRINGE
PREFILLED_SYRINGE | INTRAVENOUS | Status: AC
  Filled 2019-07-15: qty 10

## 2019-07-15 MED ORDER — SODIUM CHLORIDE 0.9% FLUSH
3.0000 mL | Freq: Two times a day (BID) | INTRAVENOUS | Status: DC
  Administered 2019-07-15: 16:00:00 3 mL via INTRAVENOUS

## 2019-07-15 MED ORDER — ARTHREX ANGEL - ACD-A SOLUTION (CHARTING ONLY) OPTIME
TOPICAL | Status: DC | PRN
  Administered 2019-07-15: 10 mL via TOPICAL

## 2019-07-15 MED ORDER — FENTANYL CITRATE (PF) 250 MCG/5ML IJ SOLN
INTRAMUSCULAR | Status: DC | PRN
  Administered 2019-07-15 (×5): 50 ug via INTRAVENOUS

## 2019-07-15 MED ORDER — CEFAZOLIN SODIUM-DEXTROSE 2-4 GM/100ML-% IV SOLN
2.0000 g | Freq: Three times a day (TID) | INTRAVENOUS | Status: AC
  Administered 2019-07-15 (×2): 2 g via INTRAVENOUS
  Filled 2019-07-15 (×2): qty 100

## 2019-07-15 MED ORDER — DEXAMETHASONE SODIUM PHOSPHATE 10 MG/ML IJ SOLN
INTRAMUSCULAR | Status: AC
  Filled 2019-07-15: qty 1

## 2019-07-15 MED ORDER — FENTANYL CITRATE (PF) 250 MCG/5ML IJ SOLN
INTRAMUSCULAR | Status: AC
  Filled 2019-07-15: qty 5

## 2019-07-15 MED ORDER — PROPOFOL 10 MG/ML IV BOLUS
INTRAVENOUS | Status: DC | PRN
  Administered 2019-07-15: 120 mg via INTRAVENOUS

## 2019-07-15 MED ORDER — BUPIVACAINE HCL (PF) 0.25 % IJ SOLN
INTRAMUSCULAR | Status: AC
  Filled 2019-07-15: qty 30

## 2019-07-15 MED ORDER — THROMBIN 5000 UNITS EX SOLR
CUTANEOUS | Status: AC
  Filled 2019-07-15: qty 5000

## 2019-07-15 MED ORDER — HYDROMORPHONE HCL 1 MG/ML IJ SOLN
INTRAMUSCULAR | Status: AC
  Filled 2019-07-15: qty 1

## 2019-07-15 MED ORDER — THROMBIN 20000 UNITS EX SOLR
CUTANEOUS | Status: DC | PRN
  Administered 2019-07-15: 20 mL via TOPICAL

## 2019-07-15 MED ORDER — ONDANSETRON HCL 4 MG/2ML IJ SOLN
4.0000 mg | Freq: Four times a day (QID) | INTRAMUSCULAR | Status: DC | PRN
  Administered 2019-07-15: 18:00:00 4 mg via INTRAVENOUS
  Filled 2019-07-15: qty 2

## 2019-07-15 MED ORDER — ONDANSETRON HCL 4 MG/2ML IJ SOLN
INTRAMUSCULAR | Status: DC | PRN
  Administered 2019-07-15: 4 mg via INTRAVENOUS

## 2019-07-15 MED ORDER — MORPHINE SULFATE (PF) 2 MG/ML IV SOLN
2.0000 mg | INTRAVENOUS | Status: DC | PRN
  Administered 2019-07-15: 16:00:00 2 mg via INTRAVENOUS
  Filled 2019-07-15: qty 1

## 2019-07-15 MED ORDER — DEXAMETHASONE 4 MG PO TABS
4.0000 mg | ORAL_TABLET | Freq: Four times a day (QID) | ORAL | Status: DC
  Administered 2019-07-15 – 2019-07-16 (×2): 4 mg via ORAL
  Filled 2019-07-15 (×2): qty 1

## 2019-07-15 MED ORDER — DEXAMETHASONE SODIUM PHOSPHATE 10 MG/ML IJ SOLN
INTRAMUSCULAR | Status: DC | PRN
  Administered 2019-07-15: 10 mg via INTRAVENOUS

## 2019-07-15 MED ORDER — ALBUMIN HUMAN 5 % IV SOLN
INTRAVENOUS | Status: DC | PRN
  Administered 2019-07-15: 12:00:00 via INTRAVENOUS

## 2019-07-15 MED ORDER — METHOCARBAMOL 1000 MG/10ML IJ SOLN
500.0000 mg | Freq: Four times a day (QID) | INTRAVENOUS | Status: DC | PRN
  Filled 2019-07-15: qty 5

## 2019-07-15 MED ORDER — CHLORHEXIDINE GLUCONATE CLOTH 2 % EX PADS: 6.0000 | MEDICATED_PAD | Freq: Once | CUTANEOUS | Status: DC

## 2019-07-15 MED ORDER — CEFAZOLIN SODIUM-DEXTROSE 2-4 GM/100ML-% IV SOLN
2.0000 g | INTRAVENOUS | Status: AC
  Administered 2019-07-15: 09:00:00 2 g via INTRAVENOUS
  Filled 2019-07-15: qty 100

## 2019-07-15 MED ORDER — 0.9 % SODIUM CHLORIDE (POUR BTL) OPTIME
TOPICAL | Status: DC | PRN
  Administered 2019-07-15: 1000 mL

## 2019-07-15 MED ORDER — ACETAMINOPHEN 500 MG PO TABS
1000.0000 mg | ORAL_TABLET | Freq: Once | ORAL | Status: AC
  Administered 2019-07-15: 07:00:00 1000 mg via ORAL
  Filled 2019-07-15: qty 2

## 2019-07-15 MED ORDER — ROCURONIUM BROMIDE 10 MG/ML (PF) SYRINGE
PREFILLED_SYRINGE | INTRAVENOUS | Status: DC | PRN
  Administered 2019-07-15: 50 mg via INTRAVENOUS
  Administered 2019-07-15: 30 mg via INTRAVENOUS
  Administered 2019-07-15: 20 mg via INTRAVENOUS

## 2019-07-15 MED ORDER — LIDOCAINE 2% (20 MG/ML) 5 ML SYRINGE
INTRAMUSCULAR | Status: AC
  Filled 2019-07-15: qty 5

## 2019-07-15 MED ORDER — POTASSIUM CHLORIDE IN NACL 20-0.9 MEQ/L-% IV SOLN
INTRAVENOUS | Status: DC
  Administered 2019-07-15: 16:00:00 via INTRAVENOUS
  Filled 2019-07-15: qty 1000

## 2019-07-15 MED ORDER — THROMBIN 20000 UNITS EX SOLR
CUTANEOUS | Status: AC
  Filled 2019-07-15: qty 20000

## 2019-07-15 MED ORDER — SODIUM CHLORIDE 0.9 % IV SOLN: 250.0000 mL | INTRAVENOUS | Status: DC

## 2019-07-15 MED ORDER — SUGAMMADEX SODIUM 200 MG/2ML IV SOLN
INTRAVENOUS | Status: DC | PRN
  Administered 2019-07-15: 150 mg via INTRAVENOUS

## 2019-07-15 MED ORDER — ONDANSETRON HCL 4 MG/2ML IJ SOLN
INTRAMUSCULAR | Status: AC
  Filled 2019-07-15: qty 2

## 2019-07-15 MED ORDER — MENTHOL 3 MG MT LOZG: 1.0000 | LOZENGE | OROMUCOSAL | Status: DC | PRN

## 2019-07-15 MED ORDER — LACTATED RINGERS IV SOLN
INTRAVENOUS | Status: DC | PRN
  Administered 2019-07-15 (×2): via INTRAVENOUS

## 2019-07-15 MED ORDER — THROMBIN 5000 UNITS EX SOLR
OROMUCOSAL | Status: DC | PRN
  Administered 2019-07-15: 5 mL via TOPICAL

## 2019-07-15 MED ORDER — PHENOL 1.4 % MT LIQD: 1.0000 | OROMUCOSAL | Status: DC | PRN

## 2019-07-15 MED ORDER — METHOCARBAMOL 500 MG PO TABS
500.0000 mg | ORAL_TABLET | Freq: Four times a day (QID) | ORAL | Status: DC | PRN
  Administered 2019-07-15: 18:00:00 500 mg via ORAL
  Filled 2019-07-15: qty 1

## 2019-07-15 MED ORDER — SUCCINYLCHOLINE CHLORIDE 200 MG/10ML IV SOSY
PREFILLED_SYRINGE | INTRAVENOUS | Status: AC
  Filled 2019-07-15: qty 10

## 2019-07-15 MED ORDER — PHENYLEPHRINE 40 MCG/ML (10ML) SYRINGE FOR IV PUSH (FOR BLOOD PRESSURE SUPPORT)
PREFILLED_SYRINGE | INTRAVENOUS | Status: DC | PRN
  Administered 2019-07-15: 80 ug via INTRAVENOUS

## 2019-07-15 MED ORDER — PHENYLEPHRINE 40 MCG/ML (10ML) SYRINGE FOR IV PUSH (FOR BLOOD PRESSURE SUPPORT)
PREFILLED_SYRINGE | INTRAVENOUS | Status: AC
  Filled 2019-07-15: qty 10

## 2019-07-15 MED ORDER — FENTANYL CITRATE (PF) 100 MCG/2ML IJ SOLN
50.0000 ug | INTRAMUSCULAR | Status: DC | PRN
  Administered 2019-07-15: 08:00:00 50 ug via INTRAVENOUS
  Filled 2019-07-15: qty 2

## 2019-07-15 MED ORDER — PROMETHAZINE HCL 25 MG/ML IJ SOLN: 6.2500 mg | INTRAMUSCULAR | Status: DC | PRN

## 2019-07-15 MED ORDER — LIDOCAINE 2% (20 MG/ML) 5 ML SYRINGE
INTRAMUSCULAR | Status: DC | PRN
  Administered 2019-07-15: 40 mg via INTRAVENOUS

## 2019-07-15 MED ORDER — SODIUM CHLORIDE 0.9% FLUSH: 3.0000 mL | INTRAVENOUS | Status: DC | PRN

## 2019-07-15 MED ORDER — CELECOXIB 200 MG PO CAPS
200.0000 mg | ORAL_CAPSULE | Freq: Two times a day (BID) | ORAL | Status: DC
  Administered 2019-07-15 (×2): 200 mg via ORAL
  Filled 2019-07-15 (×2): qty 1

## 2019-07-15 MED ORDER — OXYCODONE HCL 5 MG PO TABS
5.0000 mg | ORAL_TABLET | ORAL | Status: DC | PRN
  Administered 2019-07-15 – 2019-07-16 (×3): 5 mg via ORAL
  Filled 2019-07-15 (×3): qty 1

## 2019-07-15 MED ORDER — ONDANSETRON HCL 4 MG PO TABS: 4.0000 mg | ORAL_TABLET | Freq: Four times a day (QID) | ORAL | Status: DC | PRN

## 2019-07-15 MED ORDER — SODIUM CHLORIDE 0.9 % IV SOLN
INTRAVENOUS | Status: DC | PRN
  Administered 2019-07-15: 500 mL

## 2019-07-15 MED ORDER — HEPARIN SODIUM (PORCINE) 1000 UNIT/ML IJ SOLN
INTRAMUSCULAR | Status: DC | PRN
  Administered 2019-07-15: 5000 [IU]

## 2019-07-15 MED ORDER — SODIUM CHLORIDE 0.9 % IV SOLN
INTRAVENOUS | Status: DC | PRN
  Administered 2019-07-15: 09:00:00 20 ug/min via INTRAVENOUS

## 2019-07-15 MED ORDER — HEPARIN SODIUM (PORCINE) 1000 UNIT/ML IJ SOLN
INTRAMUSCULAR | Status: AC
  Filled 2019-07-15: qty 1

## 2019-07-15 MED ORDER — HYDROMORPHONE HCL 1 MG/ML IJ SOLN
0.2500 mg | INTRAMUSCULAR | Status: DC | PRN
  Administered 2019-07-15 (×2): 0.5 mg via INTRAVENOUS

## 2019-07-15 MED ORDER — DEXAMETHASONE SODIUM PHOSPHATE 4 MG/ML IJ SOLN
4.0000 mg | Freq: Four times a day (QID) | INTRAMUSCULAR | Status: DC
  Administered 2019-07-15: 18:00:00 4 mg via INTRAVENOUS
  Filled 2019-07-15: qty 1

## 2019-07-15 MED ORDER — DEXAMETHASONE SODIUM PHOSPHATE 10 MG/ML IJ SOLN
10.0000 mg | Freq: Once | INTRAMUSCULAR | Status: DC
  Filled 2019-07-15: qty 1

## 2019-07-15 MED ORDER — EPHEDRINE 5 MG/ML INJ
INTRAVENOUS | Status: AC
  Filled 2019-07-15: qty 10

## 2019-07-15 MED ORDER — SENNA 8.6 MG PO TABS
1.0000 | ORAL_TABLET | Freq: Two times a day (BID) | ORAL | Status: DC
  Administered 2019-07-15 (×2): 8.6 mg via ORAL
  Filled 2019-07-15: qty 1

## 2019-07-15 MED ORDER — ACETAMINOPHEN 650 MG RE SUPP: 650.0000 mg | RECTAL | Status: DC | PRN

## 2019-07-15 MED ORDER — ACETAMINOPHEN 325 MG PO TABS: 650.0000 mg | ORAL_TABLET | ORAL | Status: DC | PRN

## 2019-07-15 MED ORDER — PROPOFOL 10 MG/ML IV BOLUS
INTRAVENOUS | Status: AC
  Filled 2019-07-15: qty 20

## 2019-07-15 SURGICAL SUPPLY — 64 items
BAG DECANTER FOR FLEXI CONT (MISCELLANEOUS) ×3 IMPLANT
BASKET BONE COLLECTION (BASKET) ×3 IMPLANT
BENZOIN TINCTURE PRP APPL 2/3 (GAUZE/BANDAGES/DRESSINGS) ×3 IMPLANT
BLADE CLIPPER SURG (BLADE) IMPLANT
BUR MATCHSTICK NEURO 3.0 LAGG (BURR) ×3 IMPLANT
CANISTER SUCT 3000ML PPV (MISCELLANEOUS) ×3 IMPLANT
CARTRIDGE OIL MAESTRO DRILL (MISCELLANEOUS) ×1 IMPLANT
CLOSURE WOUND 1/2 X4 (GAUZE/BANDAGES/DRESSINGS) ×1
CONT SPEC 4OZ CLIKSEAL STRL BL (MISCELLANEOUS) ×3 IMPLANT
COVER BACK TABLE 60X90IN (DRAPES) ×3 IMPLANT
COVER WAND RF STERILE (DRAPES) ×3 IMPLANT
DERMABOND ADVANCED (GAUZE/BANDAGES/DRESSINGS) ×2
DERMABOND ADVANCED .7 DNX12 (GAUZE/BANDAGES/DRESSINGS) ×1 IMPLANT
DIFFUSER DRILL AIR PNEUMATIC (MISCELLANEOUS) ×3 IMPLANT
DRAPE C-ARM 42X72 X-RAY (DRAPES) ×3 IMPLANT
DRAPE C-ARMOR (DRAPES) ×3 IMPLANT
DRAPE LAPAROTOMY 100X72X124 (DRAPES) ×3 IMPLANT
DRAPE POUCH INSTRU U-SHP 10X18 (DRAPES) ×3 IMPLANT
DRAPE SURG 17X23 STRL (DRAPES) ×3 IMPLANT
DRSG OPSITE POSTOP 4X6 (GAUZE/BANDAGES/DRESSINGS) ×3 IMPLANT
DURAPREP 26ML APPLICATOR (WOUND CARE) ×3 IMPLANT
ELECT REM PT RETURN 9FT ADLT (ELECTROSURGICAL) ×3
ELECTRODE REM PT RTRN 9FT ADLT (ELECTROSURGICAL) ×1 IMPLANT
EVACUATOR 1/8 PVC DRAIN (DRAIN) ×3 IMPLANT
GAUZE 4X4 16PLY RFD (DISPOSABLE) IMPLANT
GLOVE BIO SURGEON STRL SZ7 (GLOVE) ×3 IMPLANT
GLOVE BIO SURGEON STRL SZ8 (GLOVE) ×6 IMPLANT
GLOVE BIOGEL PI IND STRL 7.0 (GLOVE) ×1 IMPLANT
GLOVE BIOGEL PI INDICATOR 7.0 (GLOVE) ×2
GOWN STRL REUS W/ TWL LRG LVL3 (GOWN DISPOSABLE) ×2 IMPLANT
GOWN STRL REUS W/ TWL XL LVL3 (GOWN DISPOSABLE) ×2 IMPLANT
GOWN STRL REUS W/TWL 2XL LVL3 (GOWN DISPOSABLE) IMPLANT
GOWN STRL REUS W/TWL LRG LVL3 (GOWN DISPOSABLE) ×4
GOWN STRL REUS W/TWL XL LVL3 (GOWN DISPOSABLE) ×4
HEMOSTAT POWDER KIT SURGIFOAM (HEMOSTASIS) ×3 IMPLANT
KIT BASIN OR (CUSTOM PROCEDURE TRAY) ×3 IMPLANT
KIT BONE MRW ASP ANGEL CPRP (KITS) ×3 IMPLANT
KIT TURNOVER KIT B (KITS) ×3 IMPLANT
MILL MEDIUM DISP (BLADE) IMPLANT
NEEDLE HYPO 25X1 1.5 SAFETY (NEEDLE) ×3 IMPLANT
NS IRRIG 1000ML POUR BTL (IV SOLUTION) ×3 IMPLANT
OIL CARTRIDGE MAESTRO DRILL (MISCELLANEOUS) ×3
PACK LAMINECTOMY NEURO (CUSTOM PROCEDURE TRAY) ×3 IMPLANT
PAD ARMBOARD 7.5X6 YLW CONV (MISCELLANEOUS) IMPLANT
PUTTY DBM ALLOSYNC PURE 10CC (Putty) ×3 IMPLANT
ROD LORDOTIC KODIAK 5.5X65 (Rod) ×6 IMPLANT
SCREW MOD INVICTUS 6.5X40 (Screw) ×18 IMPLANT
SCREW POLYAXIAL TULIP (Screw) ×18 IMPLANT
SET SCREW (Screw) ×12 IMPLANT
SET SCREW SPNE (Screw) ×6 IMPLANT
SPACER IDENTITI PS 9X9X25 15D (Spacer) ×6 IMPLANT
SPACER PS POROUS 8X9X25 10D (Spacer) ×6 IMPLANT
SPONGE LAP 4X18 RFD (DISPOSABLE) IMPLANT
SPONGE SURGIFOAM ABS GEL 100 (HEMOSTASIS) ×3 IMPLANT
STRIP CLOSURE SKIN 1/2X4 (GAUZE/BANDAGES/DRESSINGS) ×2 IMPLANT
SUT VIC AB 0 CT1 18XCR BRD8 (SUTURE) ×1 IMPLANT
SUT VIC AB 0 CT1 8-18 (SUTURE) ×2
SUT VIC AB 2-0 CP2 18 (SUTURE) ×3 IMPLANT
SUT VIC AB 3-0 SH 8-18 (SUTURE) ×6 IMPLANT
SYR CONTROL 10ML LL (SYRINGE) ×3 IMPLANT
TOWEL GREEN STERILE (TOWEL DISPOSABLE) ×3 IMPLANT
TOWEL GREEN STERILE FF (TOWEL DISPOSABLE) ×3 IMPLANT
TRAY FOLEY MTR SLVR 16FR STAT (SET/KITS/TRAYS/PACK) ×3 IMPLANT
WATER STERILE IRR 1000ML POUR (IV SOLUTION) ×3 IMPLANT

## 2019-07-15 NOTE — Transfer of Care (Signed)
Immediate Anesthesia Transfer of Care Note  Patient: Johnathan [name]  Procedure(s) Performed: Posterior Lumbar Interbody Fusion - Lumbar four-lumbar five - Lumbar five -Sacral one (N/A Back)  Patient Location: PACU  Anesthesia Type:General  Level of Consciousness: drowsy and patient cooperative  Airway & Oxygen Therapy: Patient Spontanous Breathing and Patient connected to nasal cannula oxygen  Post-op Assessment: Report given to RN, Post -op Vital signs reviewed and stable and Patient moving all extremities X 4  Post vital signs: Reviewed and stable  Last Vitals:  Vitals Value Taken Time  BP 110/68 07/15/19 1227  Temp    Pulse 79 07/15/19 1228  Resp 19 07/15/19 1228  SpO2 98 % 07/15/19 1228  Vitals shown include unvalidated device data.  Last Pain:  Vitals:   07/15/19 1227  PainSc: (P) Asleep      Patients Stated Pain Goal: 4 (30/86/57 8469)  Complications: No apparent anesthesia complications

## 2019-07-15 NOTE — Anesthesia Procedure Notes (Signed)
Procedure Name: Intubation Date/Time: 07/15/2019 8:40 AM Performed by: Julieta Bellini, CRNA Pre-anesthesia Checklist: Patient identified, Emergency Drugs available, Suction available and Patient being monitored Patient Re-evaluated:Patient Re-evaluated prior to induction Oxygen Delivery Method: Circle system utilized Preoxygenation: Pre-oxygenation with 100% oxygen Induction Type: IV induction Ventilation: Mask ventilation without difficulty Laryngoscope Size: Mac and 4 Grade View: Grade I Tube type: Oral Tube size: 7.5 mm Number of attempts: 1 Airway Equipment and Method: Stylet Placement Confirmation: ETT inserted through vocal cords under direct vision,  positive ETCO2 and breath sounds checked- equal and bilateral Secured at: 23 cm Tube secured with: Tape Dental Injury: Teeth and Oropharynx as per pre-operative assessment

## 2019-07-15 NOTE — Progress Notes (Signed)
Orthopedic Tech Progress Note Patient Details:  Johnathan [name] 04/29/1946 [id number]  Patient ID: Johnathan [name], male   DOB: 1946-10-18, 73 y.o.   MRN: [id number]   Johnathan [name] 07/15/2019, 1:43 PMCalled [employer] for [employer]

## 2019-07-15 NOTE — Anesthesia Postprocedure Evaluation (Signed)
Anesthesia Post Note  Patient: Johnathan [name]  Procedure(s) Performed: Posterior Lumbar Interbody Fusion - Lumbar four-lumbar five - Lumbar five -Sacral one (N/A Back)     Patient location during evaluation: PACU Anesthesia Type: General Level of consciousness: awake and alert Pain management: pain level controlled Vital Signs Assessment: post-procedure vital signs reviewed and stable Respiratory status: spontaneous breathing, nonlabored ventilation, respiratory function stable and patient connected to nasal cannula oxygen Cardiovascular status: blood pressure returned to baseline and stable Postop Assessment: no apparent nausea or vomiting Anesthetic complications: no    Last Vitals:  Vitals:   07/15/19 1432 07/15/19 1442  BP: 96/61 98/60  Pulse: 87 68  Resp: (!) 26 16  Temp:    SpO2: 97% 98%    Last Pain:  Vitals:   07/15/19 1357  PainSc: Asleep                 Ryan P Ellender

## 2019-07-15 NOTE — Op Note (Signed)
07/15/2019  12:25 PM  PATIENT:  Johnathan [name]  73 y.o. male  PRE-OPERATIVE DIAGNOSIS: Spondylolisthesis with stenosis L4-5, extraforaminal disc herniation with severe foraminal stenosis L5-S1 left, back and left L5 radiculopathy  POST-OPERATIVE DIAGNOSIS:  same  PROCEDURE:   1. Decompressive lumbar laminectomy L4-5 and L5-S1 requiring more work than would be required for a simple exposure of the disk for PLIF in order to adequately decompress the neural elements and address the spinal stenosis 2. Posterior lumbar interbody fusion L4-5 and L5-S1 using porous titanium interbody cages packed with morcellized allograft and autograft soaked with a bone marrow aspirate obtained through a separate fascial incision over the right iliac crest 3. Posterior fixation L4 S1 inclusive using Alphatec cortical pedicle screws.  4. Intertransverse arthrodesis L4-5 and L5-S1 using morcellized autograft and allograft.  SURGEON:  Sherley Bounds, MD  ASSISTANTS: Glenford Peers, FNP  ANESTHESIA:  General  EBL: 150 ml  Total I/O In: 1850 [I.V.:1600; IV Piggyback:250] Out: 401 [Urine:305; Blood:150]  BLOOD ADMINISTERED:none  DRAINS: none   INDICATION FOR PROCEDURE: This patient presented with severe back and left leg pain. Imaging revealed extraforaminal disc herniation L5-S1 left compressing the left L5 nerve root and spondylolisthesis at L4-5 with spinal stenosis. The patient tried a reasonable attempt at conservative medical measures without relief. I recommended decompression and instrumented fusion to address the stenosis as well as the segmental  instability.  Patient understood the risks, benefits, and alternatives and potential outcomes and wished to proceed.  PROCEDURE DETAILS:  The patient was brought to the operating room. After induction of generalized endotracheal anesthesia the patient was rolled into the prone position on chest rolls and all pressure points were padded. The patient's  lumbar region was cleaned and then prepped with DuraPrep and draped in the usual sterile fashion. Anesthesia was injected and then a dorsal midline incision was made and carried down to the lumbosacral fascia. The fascia was opened and the paraspinous musculature was taken down in a subperiosteal fashion to expose L4-5 and L5-S1. A self-retaining retractor was placed. Intraoperative fluoroscopy confirmed my level, and I started with placement of the L4 cortical pedicle screws. The pedicle screw entry zones were identified utilizing surface landmarks and  AP and lateral fluoroscopy. I scored the cortex with the high-speed drill and then used the hand drill to drill an upward and outward direction into the pedicle. I then tapped line to line. I then placed a 6.5 x 40 mm cortical pedicle screw into the pedicles of L4 bilaterally.  I then dissected in a suprafascial plane to expose the iliac crest.  Open the fascia used a Jamshidi needle to extract 60 cc of bone marrow aspirate from the iliac crest.  This was then spun down by [facility] device and 2 to 4 cc of  BMAC was soaked on morselized allograft for later arthrodesis.  I dried the hole with Surgifoam and closed the fascia.  I then turned my attention to the decompression and complete lumbar laminectomies, hemi- facetectomies, and foraminotomies were performed at L4-5 and L5-S1. The patient had significant spinal stenosis and this required more work than would be required for a simple exposure of the disc for posterior lumbar interbody fusion which would only require a limited laminotomy. Much more generous decompression and generous foraminotomy was undertaken in order to adequately decompress the neural elements and address the patient's leg pain. The yellow ligament was removed to expose the underlying dura and nerve roots, and generous foraminotomies were performed to adequately  decompress the neural elements. Both the exiting and traversing nerve roots were  decompressed on both sides until a coronary dilator passed easily along the nerve roots.  We did an extraforaminal microdiscectomy at L5-S1 on the left to decompress the L5 nerve root.  We did complete facetectomy also.  Once the decompression was complete, I turned my attention to the posterior lower lumbar interbody fusion. The epidural venous vasculature was coagulated and cut sharply. Disc space was incised and the initial discectomy was performed with pituitary rongeurs. The disc space was distracted with sequential distractors to a height of 9 mm. We then used a series of scrapers and shavers to prepare the endplates for fusion. The midline was prepared with Epstein curettes. Once the complete discectomy was finished, we packed an appropriate sized interbody cage with local autograft and morcellized allograft, gently retracted the nerve root, and tapped the cage into position at L4-5 and L5-S1.  The midline between the cages was packed with morselized autograft and allograft. We then turned our attention to the placement of the lower pedicle screws. The pedicle screw entry zones were identified utilizing surface landmarks and fluoroscopy. I drilled into each pedicle utilizing the hand drill, and tapped each pedicle with the appropriate tap. We palpated with a ball probe to assure no break in the cortex. We then placed 6.5 x 40 mm pedicle screws into the pedicles bilaterally at L5 and S1. We then decorticated the transverse processes and laid a mixture of morcellized autograft and allograft out over these to perform intertransverse arthrodesis at L4-5 and L5-S1. We then placed lordotic rods into the multiaxial screw heads of the pedicle screws and locked these in position with the locking caps and anti-torque device. We then checked our construct with AP and lateral fluoroscopy. Irrigated with copious amounts of bacitracin-containing saline solution. Inspected the nerve roots once again to assure adequate  decompression, lined to the dura with Gelfoam, placed powdered vancomycin into the wound, and closed the muscle and the fascia with 0 Vicryl. Closed the subcutaneous tissues with 2-0 Vicryl and subcuticular tissues with 3-0 Vicryl. The skin was closed with benzoin and Steri-Strips. Dressing was then applied, the patient was awakened from general anesthesia and transported to the recovery room in stable condition. At the end of the procedure all sponge, needle and instrument counts were correct.   PLAN OF CARE: admit to inpatient  PATIENT DISPOSITION:  PACU - hemodynamically stable.   Delay start of Pharmacological VTE agent (>24hrs) due to surgical blood loss or risk of bleeding:  yes

## 2019-07-15 NOTE — Evaluation (Addendum)
Physical Therapy Evaluation and Discharge Patient Details Name: Johnathan [name] MRN: [id number] DOB: Dec 17, 1945 Today's Date: 07/15/2019   History of Present Illness  Pt is a 73 y/o male s/p L4-S1 PLIF. PMH includes anemia, GERD, HLD, s/p pacemaker, and s/p cervical fusion.   Clinical Impression  Patient evaluated by Physical Therapy with no further acute PT needs identified. All education has been completed and the patient has no further questions. Pt overall steady with gait with use of cane. Educated about generalized walking program and back precautions. Reports friends can check in on him if needed. See below for any follow-up Physical Therapy or equipment needs. PT is signing off. Thank you for this referral.     Follow Up Recommendations No PT follow up    Equipment Recommendations  None recommended by PT    Recommendations for Other Services       Precautions / Restrictions Precautions Precautions: Back Precaution Booklet Issued: Yes (comment) Precaution Comments: reviewed back precautions with pt.  Required Braces or Orthoses: Spinal Brace Spinal Brace: Lumbar corset;Applied in sitting position Restrictions Weight Bearing Restrictions: No      Mobility  Bed Mobility               General bed mobility comments: Sitting EOB upon entry.   Transfers Overall transfer level: Needs assistance Equipment used: Straight cane Transfers: Sit to/from Stand Sit to Stand: Supervision         General transfer comment: supervision for safety  Ambulation/Gait Ambulation/Gait assistance: Min guard;Supervision Gait Distance (Feet): 400 Feet Assistive device: Straight cane Gait Pattern/deviations: Step-through pattern;Decreased stride length Gait velocity: Decreased   General Gait Details: Slower, guarded gait, however, overall steady with use of cane. Pt reports no pain in LLE during ambulation. Demonstrated safe use of cane during ambulation. Educated about  generalized walking program to perform at home.   Stairs            Wheelchair Mobility    Modified Rankin (Stroke Patients Only)       Balance Overall balance assessment: Needs assistance Sitting-balance support: No upper extremity supported;Feet supported Sitting balance-Leahy Scale: Good     Standing balance support: Single extremity supported;During functional activity;No upper extremity supported Standing balance-Leahy Scale: Fair Standing balance comment: Able to maintain static standing without UE support                              Pertinent Vitals/Pain Pain Assessment: Faces Faces Pain Scale: Hurts a little bit Pain Location: back Pain Descriptors / Indicators: ("stiffness") Pain Intervention(s): Limited activity within patient's tolerance;Monitored during session;Repositioned    Home Living Family/patient expects to be discharged to:: Private residence Living Arrangements: Alone Available Help at Discharge: Friend(s);Available PRN/intermittently Type of Home: House Home Access: Level entry     Home Layout: One level Home Equipment: Cane - single point      Prior Function Level of Independence: Independent with assistive device(s)         Comments: Used cane for ambulation     Hand Dominance        Extremity/Trunk Assessment   Upper Extremity Assessment Upper Extremity Assessment: Defer to OT evaluation    Lower Extremity Assessment Lower Extremity Assessment: LLE deficits/detail LLE Deficits / Details: Reports improvement in LLE pain    Cervical / Trunk Assessment Cervical / Trunk Assessment: Other exceptions Cervical / Trunk Exceptions: s/p PLIF  Communication   Communication: No difficulties  Cognition  Arousal/Alertness: Awake/alert Behavior During Therapy: WFL for tasks assessed/performed Overall Cognitive Status: Within Functional Limits for tasks assessed                                         General Comments      Exercises     Assessment/Plan    PT Assessment Patent does not need any further PT services  PT Problem List         PT Treatment Interventions      PT Goals (Current goals can be found in the Care Plan section)  Acute Rehab PT Goals Patient Stated Goal: to go home PT Goal Formulation: With patient Time For Goal Achievement: 07/15/19 Potential to Achieve Goals: Good    Frequency     Barriers to discharge        Co-evaluation               AM-PAC PT "6 Clicks" Mobility  Outcome Measure Help needed turning from your back to your side while in a flat bed without using bedrails?: None Help needed moving from lying on your back to sitting on the side of a flat bed without using bedrails?: None Help needed moving to and from a bed to a chair (including a wheelchair)?: None Help needed standing up from a chair using your arms (e.g., wheelchair or bedside chair)?: None Help needed to walk in hospital room?: A Little Help needed climbing 3-5 steps with a railing? : A Little 6 Click Score: 22    End of Session Equipment Utilized During Treatment: Gait belt;Back brace Activity Tolerance: Patient tolerated treatment well Patient left: in bed;with call bell/phone within reach(sitting EOB ) Nurse Communication: Mobility status PT Visit Diagnosis: Other abnormalities of gait and mobility (R26.89)    Time: 4142-3953 PT Time Calculation (min) (ACUTE ONLY): 21 min   Charges:   PT Evaluation $PT Eval Low Complexity: [place], PT, DPT  Acute Rehabilitation Services  Pager: [phone] Office: [phone]   Rudean Hitt 07/15/2019, 6:00 PM

## 2019-07-15 NOTE — H&P (Signed)
Subjective: Patient is a 73 y.o. male admitted for plif. Onset of symptoms was several months ago, rapidly worsening since that time.  The pain is rated severe, and is located at the across the lower back and radiates to LLE. The pain is described as aching and occurs all day. The symptoms have been progressive. Symptoms are exacerbated by exercise. MRI or CT showed spondylolisthesis L4-5, HNP far lateral L5S1   Past Medical History:  Diagnosis Date  . Achalasia of esophagus    followed by Lizbeth Bark  . Anemia   . Arthritis    spondylisthesis- lumbar  . Bradycardia   . GERD (gastroesophageal reflux disease)   . H/O: GI bleed   . Herniated disc, cervical   . Hiatal hernia   . Hyperlipidemia   . Metacarpal bone fracture   . Presence of permanent cardiac pacemaker 04/16/2017  . Rosacea, acne   . SSS (sick sinus syndrome) [facility])     Past Surgical History:  Procedure Laterality Date  . CARDIAC CATHETERIZATION N/A 06/20/2015   Procedure: Left Heart Cath and Coronary Angiography;  Surgeon: Troy Sine, MD;  Location: [place] [facility];  Service: Cardiovascular;  Laterality: N/A;  . CERVICAL FUSION    . COLONOSCOPY WITH PROPOFOL N/A 06/13/2015   Procedure: COLONOSCOPY WITH PROPOFOL;  Surgeon: Arta Silence, MD;  Location: [facility];  Service: Endoscopy;  Laterality: N/A;  . ENDOVENOUS ABLATION SAPHENOUS VEIN W/ LASER Left 05/28/2019   endovenous laser ablation left greater saphenous vein by Deitra Mayo MD   . ESOPHAGOGASTRODUODENOSCOPY (EGD) WITH PROPOFOL N/A 06/13/2015   Procedure: ESOPHAGOGASTRODUODENOSCOPY (EGD) WITH PROPOFOL;  Surgeon: Arta Silence, MD;  Location: [facility];  Service: Endoscopy;  Laterality: N/A;  . HERNIA REPAIR Right    also had surgery for varicocele  . INSERT / REPLACE / REMOVE PACEMAKER    . MYOTOMY    . NISSEN FUNDOPLICATION  1610  . PACEMAKER IMPLANT N/A 04/16/2017   Procedure: Pacemaker Implant;  Surgeon: Constance Haw, MD;  Location:  [place] [facility];  Service: Cardiovascular;  Laterality: N/A;  . TONSILLECTOMY    . VARICOCELE EXCISION Left     Prior to Admission medications   Medication Sig Start Date End Date Taking? Authorizing Provider  Ascorbic Acid (VITAMIN C) 1000 MG tablet Take 1,000 mg by mouth daily.   Yes [provider]  azelastine (ASTELIN) 0.1 % nasal spray Place 1 spray into both nostrils daily as needed for rhinitis.    Yes [provider]  calcium carbonate (TUMS EX) 750 MG chewable tablet Chew 3 tablets by mouth daily.   Yes [provider]  FeFum-FePoly-FA-B Cmp-C-Biot (INTEGRA PLUS) CAPS Take 1 capsule by mouth daily.   Yes [provider]  ibuprofen (ADVIL) 200 MG tablet Take 400 mg by mouth every 6 (six) hours as needed.   Yes [provider]  Misc Natural Products (TURMERIC CURCUMIN) CAPS Take 1 capsule by mouth daily.   Yes [provider]  Multiple Vitamins-Minerals (MULTIVITAMIN PO) Take 1 tablet by mouth daily.   Yes [provider]  omeprazole (PRILOSEC) 20 MG capsule Take 40 mg by mouth daily.    Yes [provider]  OVER THE COUNTER MEDICATION Take 1 tablet by mouth daily. dr [place] Bilberry intestinal formula 1, herbal laxative   Yes [provider]  zinc gluconate 50 MG tablet Take 50 mg by mouth daily.   Yes [provider]   Allergies  Allergen Reactions  . Tramadol Nausea  And Vomiting    Social History   Tobacco Use  . Smoking status: Never Smoker  . Smokeless tobacco: Never Used  Substance Use Topics  . Alcohol use: Yes    Alcohol/week: 0.0 standard drinks    Comment: occasional    Family History  Problem Relation Age of Onset  . Breast cancer Mother   . Diabetes Father   . Heart disease Father        No details  . Diabetes Paternal Grandmother      Review of Systems  Positive ROS: neg  All other systems have been reviewed and were otherwise negative with the exception of those  mentioned in the HPI and as above.  Objective: Vital signs in last 24 hours: Temp:  [97.5 F (36.4 C)-98.4 F (36.9 C)] 97.5 F (36.4 C) (08/19 0636) Pulse Rate:  [70-83] 70 (08/19 0636) Resp:  [17-18] 17 (08/19 0636) BP: (116-129)/(72-78) 129/72 (08/19 0636) SpO2:  [98 %-100 %] 98 % (08/19 0636) Weight:  [56.8 kg] 56.8 kg (08/19 4010)  General Appearance: Alert, cooperative, no distress, appears stated age Head: Normocephalic, without obvious abnormality, atraumatic Eyes: PERRL, conjunctiva/corneas clear, EOM's intact    Neck: Supple, symmetrical, trachea midline Back: Symmetric, no curvature, ROM normal, no CVA tenderness Lungs:  respirations unlabored Heart: Regular rate and rhythm Abdomen: Soft, non-tender Extremities: Extremities normal, atraumatic, no cyanosis or edema Pulses: 2+ and symmetric all extremities Skin: Skin color, texture, turgor normal, no rashes or lesions  NEUROLOGIC:   Mental status: Alert and oriented x4,  no aphasia, good attention span, fund of knowledge, and memory Motor Exam - grossly normal Sensory Exam - grossly normal Reflexes: 1+ Coordination - grossly normal Gait - grossly normal Balance - grossly normal Cranial Nerves: I: smell Not tested  II: visual acuity  OS: nl    OD: nl  II: visual fields Full to confrontation  II: pupils Equal, round, reactive to light  III,VII: ptosis None  III,IV,VI: extraocular muscles  Full ROM  V: mastication Normal  V: facial light touch sensation  Normal  V,VII: corneal reflex  Present  VII: facial muscle function - upper  Normal  VII: facial muscle function - lower Normal  VIII: hearing Not tested  IX: soft palate elevation  Normal  IX,X: gag reflex Present  XI: trapezius strength  5/5  XI: sternocleidomastoid strength 5/5  XI: neck flexion strength  5/5  XII: tongue strength  Normal    Data Review Lab Results  Component Value Date   WBC 7.4 07/14/2019   HGB 14.3 07/14/2019   HCT 43.6  07/14/2019   MCV 98.0 07/14/2019   PLT 308 07/14/2019   Lab Results  Component Value Date   NA 138 07/14/2019   K 3.8 07/14/2019   CL 102 07/14/2019   CO2 25 07/14/2019   BUN 9 07/14/2019   CREATININE 0.76 07/14/2019   GLUCOSE 117 (H) 07/14/2019   Lab Results  Component Value Date   INR 1.0 07/14/2019    Assessment/Plan:  Estimated body mass index is 19.05 kg/m as calculated from the following:   Height as of this encounter: 5\' 8"  (1.727 m).   Weight as of this encounter: 56.8 kg. Patient admitted for PLIF L4-5 L5-S1. Patient has failed a reasonable attempt at conservative therapy.  I explained the condition and procedure to the patient and answered any questions.  Patient wishes to proceed with procedure as planned. Understands risks/ benefits and typical outcomes of procedure.   Camelia Eng  Ronnald Ramp 07/15/2019 7:47 AM

## 2019-07-16 MED ORDER — OXYCODONE HCL 5 MG PO TABS: 5.0000 mg | ORAL_TABLET | ORAL | 0 refills | Status: DC | PRN

## 2019-07-16 MED ORDER — METHOCARBAMOL 500 MG PO TABS: 500.0000 mg | ORAL_TABLET | Freq: Four times a day (QID) | ORAL | 2 refills | Status: DC | PRN

## 2019-07-16 NOTE — Discharge Instructions (Signed)

## 2019-07-16 NOTE — Discharge Summary (Signed)
Physician Discharge Summary  Patient ID: Johnathan [name] MRN: [id number] DOB/AGE: Dec 26, 1945 73 y.o.  Admit date: 07/15/2019 Discharge date: 07/16/2019  Admission Diagnoses: spondylolisthesis with stenosis HNP radiculopathy    Discharge Diagnoses: same   Discharged Condition: good  Hospital Course: The patient was admitted on 07/15/2019 and taken to the operating room where the patient underwent PLIF L4-5 L5-S1. The patient tolerated the procedure well and was taken to the recovery room and then to the floor in stable condition. The hospital course was routine. There were no complications. The wound remained clean dry and intact. Pt had appropriate back soreness. No complaints of leg pain or new N/T/W. The patient remained afebrile with stable vital signs, and tolerated a regular diet. The patient continued to increase activities, and pain was well controlled with oral pain medications.   Consults: None  Significant Diagnostic Studies:  Results for orders placed or performed during the hospital encounter of 07/14/19  Surgical pcr screen   Specimen: Nasal Mucosa; Nasal Swab  Result Value Ref Range   MRSA, PCR NEGATIVE NEGATIVE   Staphylococcus aureus NEGATIVE NEGATIVE  Basic metabolic panel  Result Value Ref Range   Sodium 138 135 - 145 mmol/L   Potassium 3.8 3.5 - 5.1 mmol/L   Chloride 102 98 - 111 mmol/L   CO2 25 22 - 32 mmol/L   Glucose, Bld 117 (H) 70 - 99 mg/dL   BUN 9 8 - 23 mg/dL   Creatinine, Ser 0.76 0.61 - 1.24 mg/dL   Calcium 9.9 8.9 - 10.3 mg/dL   GFR calc non Af Amer >60 >60 mL/min   GFR calc Af Amer >60 >60 mL/min   Anion gap 11 5 - 15  CBC WITH DIFFERENTIAL  Result Value Ref Range   WBC 7.4 4.0 - 10.5 K/uL   RBC 4.45 4.22 - 5.81 MIL/uL   Hemoglobin 14.3 13.0 - 17.0 g/dL   HCT 43.6 39.0 - 52.0 %   MCV 98.0 80.0 - 100.0 fL   MCH 32.1 26.0 - 34.0 pg   MCHC 32.8 30.0 - 36.0 g/dL   RDW 13.1 11.5 - 15.5 %   Platelets 308 150 - 400 K/uL   nRBC 0.0 0.0 - 0.2 %    Neutrophils Relative % 81 %   Neutro Abs 6.0 1.7 - 7.7 K/uL   Lymphocytes Relative 8 %   Lymphs Abs 0.6 (L) 0.7 - 4.0 K/uL   Monocytes Relative 8 %   Monocytes Absolute 0.6 0.1 - 1.0 K/uL   Eosinophils Relative 1 %   Eosinophils Absolute 0.1 0.0 - 0.5 K/uL   Basophils Relative 1 %   Basophils Absolute 0.1 0.0 - 0.1 K/uL   Immature Granulocytes 1 %   Abs Immature Granulocytes 0.04 0.00 - 0.07 K/uL  Protime-INR  Result Value Ref Range   Prothrombin Time 13.0 11.4 - 15.2 seconds   INR 1.0 0.8 - 1.2  Type and screen [place]  Result Value Ref Range   ABO/RH(D) A POS    Antibody Screen NEG    Sample Expiration 07/28/2019,2359    Extend sample reason      NO TRANSFUSIONS OR PREGNANCY IN THE PAST 3 MONTHS Performed at [facility], [address] [place], [place] [address]     Dg Lumbar Spine 2-3 Views  Result Date: 07/15/2019 CLINICAL DATA:  L4-S1 fusion EXAM: DG C-ARM 61-120 MIN; LUMBAR SPINE - 2-3 VIEW COMPARISON:  None. FLUOROSCOPY TIME:  Fluoroscopy Time:  1  minutes 29 seconds Radiation Exposure Index (if provided by the fluoroscopic device): Not available Number of Acquired Spot Images: 2 FINDINGS: Interbody fusion is noted at L4-5 and L5-S1. Pedicle screws are noted at all 3 levels with posterior fixation. IMPRESSION: L4-S1 fusion. Electronically Signed   By: Inez Catalina M.D.   On: 07/15/2019 12:44   Dg C-arm 1-60 Min  Result Date: 07/15/2019 CLINICAL DATA:  L4-S1 fusion EXAM: DG C-ARM 61-120 MIN; LUMBAR SPINE - 2-3 VIEW COMPARISON:  None. FLUOROSCOPY TIME:  Fluoroscopy Time:  1 minutes 29 seconds Radiation Exposure Index (if provided by the fluoroscopic device): Not available Number of Acquired Spot Images: 2 FINDINGS: Interbody fusion is noted at L4-5 and L5-S1. Pedicle screws are noted at all 3 levels with posterior fixation. IMPRESSION: L4-S1 fusion. Electronically Signed   By: Inez Catalina M.D.   On: 07/15/2019 12:44    Antibiotics:   Anti-infectives (From admission, onward)   Start     Dose/Rate Route Frequency Ordered Stop   07/15/19 1645  ceFAZolin (ANCEF) IVPB 2g/100 mL premix     2 g 200 mL/hr over 30 Minutes Intravenous Every 8 hours 07/15/19 1500 07/16/19 0015   07/15/19 0821  bacitracin 50,000 Units in sodium chloride 0.9 % 500 mL irrigation  Status:  Discontinued       As needed 07/15/19 0932 07/15/19 1223   07/15/19 0645  ceFAZolin (ANCEF) IVPB 2g/100 mL premix     2 g 200 mL/hr over 30 Minutes Intravenous On call to O.R. 07/15/19 1017 07/15/19 0859      Discharge Exam: Blood pressure 97/64, pulse 60, temperature 97.9 F (36.6 C), temperature source Oral, resp. rate 18, height 5\' 8"  (1.727 m), weight 56.8 kg, SpO2 100 %. Neurologic: Grossly normal Dressing dry  Discharge Medications:   Allergies as of 07/16/2019      Reactions   Tramadol Nausea And Vomiting      Medication List    TAKE these medications   azelastine 0.1 % nasal spray Commonly known as: ASTELIN Place 1 spray into both nostrils daily as needed for rhinitis.   calcium carbonate 750 MG chewable tablet Commonly known as: TUMS EX Chew 3 tablets by mouth daily.   ibuprofen 200 MG tablet Commonly known as: ADVIL Take 400 mg by mouth every 6 (six) hours as needed.   Integra Plus Caps Take 1 capsule by mouth daily.   methocarbamol 500 MG tablet Commonly known as: ROBAXIN Take 1 tablet (500 mg total) by mouth every 6 (six) hours as needed for muscle spasms.   MULTIVITAMIN PO Take 1 tablet by mouth daily.   omeprazole 20 MG capsule Commonly known as: PRILOSEC Take 40 mg by mouth daily.   OVER THE COUNTER MEDICATION Take 1 tablet by mouth daily. dr Auburn Bilberry intestinal formula 1, herbal laxative   oxyCODONE 5 MG immediate release tablet Commonly known as: Oxy IR/ROXICODONE Take 1 tablet (5 mg total) by mouth every 4 (four) hours as needed for moderate pain ((score 4 to 6)).   Turmeric Curcumin Caps Take 1 capsule by mouth  daily.   vitamin C 1000 MG tablet Take 1,000 mg by mouth daily.   zinc gluconate 50 MG tablet Take 50 mg by mouth daily.            Durable Medical Equipment  (From admission, onward)         Start     Ordered   07/15/19 1501  DME Walker rolling  Once    Question:  Patient needs a walker to treat with the following condition  Answer:  S/P lumbar fusion   07/15/19 1500   07/15/19 1501  DME 3 n 1  Once     07/15/19 1500          Disposition: home   Final Dx: PLIF L4-5 L5-S1  Discharge Instructions     Remove dressing in 72 hours   Complete by: As directed    Call MD for:  difficulty breathing, headache or visual disturbances   Complete by: As directed    Call MD for:  persistant nausea and vomiting   Complete by: As directed    Call MD for:  redness, tenderness, or signs of infection (pain, swelling, redness, odor or green/yellow discharge around incision site)   Complete by: As directed    Call MD for:  severe uncontrolled pain   Complete by: As directed    Call MD for:  temperature >100.4   Complete by: As directed    Diet - low sodium heart healthy   Complete by: As directed    Increase activity slowly   Complete by: As directed          Signed: Eustace Moore 07/16/2019, 7:49 AM

## 2019-07-16 NOTE — Progress Notes (Signed)
Patient is discharged from room 3C02 at this time. Alert and in stable condition. IV site d/c'd and instructions read to patient with understanding verbalized. Left unit via wheelchair with all belongings at side.

## 2019-07-16 NOTE — Progress Notes (Signed)
Occupational Therapy Evaluation Patient Details Name: Johnathan [name] MRN: [id number] DOB: 04/22/1946 Today's Date: 07/16/2019    History of Present Illness Pt is a 73 y/o male s/p L4-S1 PLIF. PMH includes anemia, GERD, HLD, s/p pacemaker, and s/p cervical fusion.    Clinical Impression   Completed all education regarding compensatory techniques and use of AE for LB ADL and IADL. Pt @ modifeid independent level after session. No further OT needs.     Follow Up Recommendations  No OT follow up;Supervision/Assistance - 24 hour    Equipment Recommendations  None recommended by OT    Recommendations for Other Services       Precautions / Restrictions Precautions Precautions: Back Precaution Booklet Issued: Yes (comment) Precaution Comments: reviewed back precautions with pt.  Required Braces or Orthoses: Spinal Brace Spinal Brace: Lumbar corset;Applied in sitting position Restrictions Weight Bearing Restrictions: No      Mobility Bed Mobility Overal bed mobility: Modified Independent             General bed mobility comments: goode technique  Transfers Overall transfer level: Modified independent                    Balance Overall balance assessment: No apparent balance deficits (not formally assessed)                                         ADL either performed or assessed with clinical judgement   ADL Overall ADL's : Needs assistance/impaired                                     Functional mobility during ADLs: Modified independent General ADL Comments: Educated pt on use of compensatory techniques in addition to AE to complete LB ADL. Educated pt on use of reacher to assist with IADL tasks. Discussed management of cooking and laundry. Pt verbalized understanding. Written handout reviewed/      Vision Baseline Vision/History: Wears glasses       Perception     Praxis      Pertinent Vitals/Pain Pain  Assessment: 0-10 Pain Score: 3  Pain Location: back Pain Descriptors / Indicators: Aching;Discomfort Pain Intervention(s): Limited activity within patient's tolerance     Hand Dominance Right   Extremity/Trunk Assessment Upper Extremity Assessment Upper Extremity Assessment: Overall WFL for tasks assessed   Lower Extremity Assessment Lower Extremity Assessment: Defer to PT evaluation   Cervical / Trunk Assessment Cervical / Trunk Assessment: Other exceptions(back fusion)   Communication Communication Communication: No difficulties   Cognition Arousal/Alertness: Awake/alert Behavior During Therapy: WFL for tasks assessed/performed Overall Cognitive Status: Within Functional Limits for tasks assessed                                     General Comments       Exercises     Shoulder Instructions      Home Living Family/patient expects to be discharged to:: Private residence Living Arrangements: Alone Available Help at Discharge: Friend(s);Available PRN/intermittently Type of Home: House Home Access: Level entry     Home Layout: One level     Bathroom Shower/Tub: Teacher, early years/pre: Standard Bathroom Accessibility: Yes How Accessible: Accessible via walker Home Equipment: Kasandra Knudsen -  single point          Prior Functioning/Environment Level of Independence: Independent with assistive device(s)                 OT Problem List: Decreased knowledge of use of DME or AE;Decreased knowledge of precautions;Pain      OT Treatment/Interventions:      OT Goals(Current goals can be found in the care plan section) Acute Rehab OT Goals Patient Stated Goal: to go home OT Goal Formulation: All assessment and education complete, DC therapy  OT Frequency:     Barriers to D/C:            Co-evaluation              AM-PAC OT "6 Clicks" Daily Activity     Outcome Measure Help from another person eating meals?: None Help from  another person taking care of personal grooming?: None Help from another person toileting, which includes using toliet, bedpan, or urinal?: None Help from another person bathing (including washing, rinsing, drying)?: None Help from another person to put on and taking off regular upper body clothing?: None Help from another person to put on and taking off regular lower body clothing?: None 6 Click Score: 24   End of Session Equipment Utilized During Treatment: Back brace Nurse Communication: Mobility status  Activity Tolerance: Patient tolerated treatment well Patient left: Other (comment)(walking in room)  OT Visit Diagnosis: Muscle weakness (generalized) (M62.81);Pain Pain - part of body: (back)                Time: [id number] OT Time Calculation (min): 23 min Charges:  OT General Charges $OT Visit: 1 Visit OT Evaluation $OT Eval Low Complexity: 1 Low OT Treatments $Self Care/Home Management : 8-22 mins  Maurie Boettcher, OT/L   Acute OT Clinical Specialist [place] Pager [phone] Office [phone]   [facility] 07/16/2019, 9:17 AM

## 2019-07-21 ENCOUNTER — Telehealth: Payer: Self-pay

## 2019-07-21 NOTE — Telephone Encounter (Signed)
Alert received for AF w/ RVR, 9 minute duration. No OAC noted. AT/AF burden <1%. Will send to Dr. Curt Bears for review.

## 2019-07-21 NOTE — Telephone Encounter (Signed)
Pt refused to start lopressor 25mg  BID, states that he will wait to discuss w/ Dr. Curt Bears when he sees him at the end of September. I stated I would add this to the appt notes. Pt states that he had surgery 07/15/19 and believes this episode is related to that.

## 2019-07-21 NOTE — Telephone Encounter (Signed)
Rapid episode of atrial fibrillation. Start lopressor 25 mg BID.

## 2019-07-28 ENCOUNTER — Ambulatory Visit (INDEPENDENT_AMBULATORY_CARE_PROVIDER_SITE_OTHER): Payer: Managed Care, Other (non HMO) | Admitting: *Deleted

## 2019-07-28 DIAGNOSIS — I495 Sick sinus syndrome: Secondary | ICD-10-CM

## 2019-07-29 LAB — CUP PACEART REMOTE DEVICE CHECK
Battery Remaining Longevity: 119 mo
Battery Remaining Percentage: 95.5 %
Battery Voltage: 3.01 V
Brady Statistic AP VP Percent: 1.2 %
Brady Statistic AP VS Percent: 57 %
Brady Statistic AS VP Percent: 1 %
Brady Statistic AS VS Percent: 42 %
Brady Statistic RA Percent Paced: 57 %
Brady Statistic RV Percent Paced: 1.2 %
Date Time Interrogation Session: 20200902044829
Implantable Lead Implant Date: 20180522
Implantable Lead Implant Date: 20180522
Implantable Lead Location: 753859
Implantable Lead Location: 753860
Implantable Pulse Generator Implant Date: 20180522
Lead Channel Impedance Value: 450 Ohm
Lead Channel Impedance Value: 450 Ohm
Lead Channel Pacing Threshold Amplitude: 0.5 V
Lead Channel Pacing Threshold Amplitude: 0.5 V
Lead Channel Pacing Threshold Pulse Width: 0.5 ms
Lead Channel Pacing Threshold Pulse Width: 0.5 ms
Lead Channel Sensing Intrinsic Amplitude: 0.8 mV
Lead Channel Sensing Intrinsic Amplitude: 8.3 mV
Lead Channel Setting Pacing Amplitude: 1.5 V
Lead Channel Setting Pacing Amplitude: 2.5 V
Lead Channel Setting Pacing Pulse Width: 0.5 ms
Lead Channel Setting Sensing Sensitivity: 2 mV
Pulse Gen Model: 2272
Pulse Gen Serial Number: 8908368

## 2019-08-13 NOTE — Progress Notes (Signed)
Remote pacemaker transmission.   

## 2019-08-18 ENCOUNTER — Other Ambulatory Visit: Payer: Self-pay | Admitting: *Deleted

## 2019-08-18 DIAGNOSIS — I83813 Varicose veins of bilateral lower extremities with pain: Secondary | ICD-10-CM

## 2019-08-18 DIAGNOSIS — I83812 Varicose veins of left lower extremities with pain: Secondary | ICD-10-CM

## 2019-08-20 ENCOUNTER — Ambulatory Visit (HOSPITAL_COMMUNITY)
Admission: RE | Admit: 2019-08-20 | Discharge: 2019-08-20 | Disposition: A | Discharge status: Home / Self Care | Payer: Managed Care, Other (non HMO) | Source: Ambulatory Visit | Attending: Family | Admitting: Family

## 2019-08-20 ENCOUNTER — Other Ambulatory Visit: Payer: Self-pay

## 2019-08-20 DIAGNOSIS — I83813 Varicose veins of bilateral lower extremities with pain: Secondary | ICD-10-CM | POA: Diagnosis present

## 2019-08-25 ENCOUNTER — Ambulatory Visit (INDEPENDENT_AMBULATORY_CARE_PROVIDER_SITE_OTHER): Payer: Managed Care, Other (non HMO) | Admitting: Cardiology

## 2019-08-25 ENCOUNTER — Encounter: Payer: Self-pay | Admitting: Cardiology

## 2019-08-25 ENCOUNTER — Other Ambulatory Visit: Payer: Self-pay

## 2019-08-25 VITALS — BP 118/72 | HR 94 | Ht 68.0 in | Wt 130.2 lb

## 2019-08-25 DIAGNOSIS — I495 Sick sinus syndrome: Secondary | ICD-10-CM

## 2019-08-25 NOTE — Progress Notes (Signed)
Electrophysiology Office Note   Date:  08/25/2019   ID:  Johnathan [name], DOB September 20, 1946, MRN [id number]  PCP:  Antony Contras, MD  Cardiologist:  [place] Primary Electrophysiologist:  Hanaan Gancarz Meredith Leeds, MD    No chief complaint on file.    History of Present Illness: Johnathan [name] is a 73 y.o. male who is being seen today for the evaluation of sinus bradycardia, PVCs at the request of Antony Contras, MD. Presenting today for electrophysiology evaluation. He was evaluated with an exercise treadmill test and was found to have profound fatigue and decreased exercise tolerance. He had ST segment depression at peak exercise, but his cardiac catheterization was normal. He presented to cardiology clinic after an EKG on 3/19 performed by his PCP which showed sinus bradycardia with rates in the 30s. St. Jude dual chamber pacemaker implanted 0000000 complicated by pneumothorax.  Today, denies symptoms of palpitations, chest pain, shortness of breath, orthopnea, PND, lower extremity edema, claudication, dizziness, presyncope, syncope, bleeding, or neurologic sequela. The patient is tolerating medications without difficulties.  He has overall been doing well.  He was having pretty severe sciatica and recently had back surgery.  His sciatica is greatly improved since that operation.  He did have an episode of atrial fibrillation but this was pretty shortly after his back surgery.  He had a rapid ventricular rate.  He would prefer not to be on medications at this time.   Past Medical History:  Diagnosis Date  . Achalasia of esophagus    followed by Lizbeth Bark  . Anemia   . Arthritis    spondylisthesis- lumbar  . Bradycardia   . GERD (gastroesophageal reflux disease)   . H/O: GI bleed   . Herniated disc, cervical   . Hiatal hernia   . Hyperlipidemia   . Metacarpal bone fracture   . Presence of permanent cardiac pacemaker 04/16/2017  . Rosacea, acne   . SSS (sick sinus syndrome)  [facility])    Past Surgical History:  Procedure Laterality Date  . CARDIAC CATHETERIZATION N/A 06/20/2015   Procedure: Left Heart Cath and Coronary Angiography;  Surgeon: Troy Sine, MD;  Location: [place] [facility];  Service: Cardiovascular;  Laterality: N/A;  . CERVICAL FUSION    . COLONOSCOPY WITH PROPOFOL N/A 06/13/2015   Procedure: COLONOSCOPY WITH PROPOFOL;  Surgeon: Arta Silence, MD;  Location: [facility];  Service: Endoscopy;  Laterality: N/A;  . ENDOVENOUS ABLATION SAPHENOUS VEIN W/ LASER Left 05/28/2019   endovenous laser ablation left greater saphenous vein by Deitra Mayo MD   . ESOPHAGOGASTRODUODENOSCOPY (EGD) WITH PROPOFOL N/A 06/13/2015   Procedure: ESOPHAGOGASTRODUODENOSCOPY (EGD) WITH PROPOFOL;  Surgeon: Arta Silence, MD;  Location: [facility];  Service: Endoscopy;  Laterality: N/A;  . HERNIA REPAIR Right    also had surgery for varicocele  . INSERT / REPLACE / REMOVE PACEMAKER    . MYOTOMY    . NISSEN FUNDOPLICATION  [id number]  . PACEMAKER IMPLANT N/A 04/16/2017   Procedure: Pacemaker Implant;  Surgeon: Constance Haw, MD;  Location: [place] [facility];  Service: Cardiovascular;  Laterality: N/A;  . TONSILLECTOMY    . VARICOCELE EXCISION Left      Current Outpatient Medications  Medication Sig Dispense Refill  . Ascorbic Acid (VITAMIN C) 1000 MG tablet Take 1,000 mg by mouth daily.    Marland Kitchen azelastine (ASTELIN) 0.1 % nasal spray Place 1 spray into both nostrils daily as needed for rhinitis.     . calcium carbonate (TUMS EX) 750  MG chewable tablet Chew 3 tablets by mouth daily.    Marland Kitchen FeFum-FePoly-FA-B Cmp-C-Biot (INTEGRA PLUS) CAPS Take 1 capsule by mouth daily.    Marland Kitchen ibuprofen (ADVIL) 200 MG tablet Take 400 mg by mouth every 6 (six) hours as needed.    . Misc Natural Products (TURMERIC CURCUMIN) CAPS Take 1 capsule by mouth daily.    . Multiple Vitamins-Minerals (MULTIVITAMIN PO) Take 1 tablet by mouth daily.    Marland Kitchen omeprazole (PRILOSEC) 20 MG capsule Take 40  mg by mouth daily.     Marland Kitchen OVER THE COUNTER MEDICATION Take 1 tablet by mouth daily. dr Auburn Bilberry intestinal formula 1, herbal laxative    . zinc gluconate 50 MG tablet Take 50 mg by mouth daily.     No current facility-administered medications for this visit.     Allergies:   Tramadol   Social History:  The patient  reports that he has never smoked. He has never used smokeless tobacco. He reports current alcohol use. He reports that he does not use drugs.   Family History:  The patient's family history includes Breast cancer in his mother; Diabetes in his father and paternal grandmother; Heart disease in his father.   ROS:  Please see the history of present illness.   Otherwise, review of systems is positive for none.   All other systems are reviewed and negative.   PHYSICAL EXAM: VS:  BP 118/72   Pulse 94   Ht 5\' 8"  (1.727 m)   Wt 130 lb 3.2 oz (59.1 kg)   SpO2 98%   BMI 19.80 kg/m  , BMI Body mass index is 19.8 kg/m. GEN: Well nourished, well developed, in no acute distress  HEENT: normal  Neck: no JVD, carotid bruits, or masses Cardiac: RRR; no murmurs, rubs, or gallops,no edema  Respiratory:  clear to auscultation bilaterally, normal work of breathing GI: soft, nontender, nondistended, + BS MS: no deformity or atrophy  Skin: warm and dry, device site well healed Neuro:  Strength and sensation are intact Psych: euthymic mood, full affect  EKG:  EKG is ordered today. Personal review of the ekg ordered shows atrial paced, rate 94  Personal review of the device interrogation today. Results in [place]: 07/14/2019: BUN 9; Creatinine, Ser 0.76; Hemoglobin 14.3; Platelets 308; Potassium 3.8; Sodium 138    Lipid Panel     Component Value Date/Time   CHOL  12/06/2010 0620    174        ATP III CLASSIFICATION:  <200     mg/dL   Desirable  200-239  mg/dL   Borderline High  >=240    mg/dL   High          TRIG 109 12/06/2010 0620   HDL 59 12/06/2010 0620    CHOLHDL 2.9 12/06/2010 0620   VLDL 22 12/06/2010 0620   LDLCALC  12/06/2010 0620    93        Total Cholesterol/HDL:CHD Risk Coronary Heart Disease Risk Table                     Men   Women  1/2 Average Risk   3.4   3.3  Average Risk       5.0   4.4  2 X Average Risk   9.6   7.1  3 X Average Risk  23.4   11.0        Use the calculated Patient Ratio above and the CHD  Risk Table to determine the patient's CHD Risk.        ATP III CLASSIFICATION (LDL):  <100     mg/dL   Optimal  100-129  mg/dL   Near or Above                    Optimal  130-159  mg/dL   Borderline  160-189  mg/dL   High  >190     mg/dL   Very High     Wt Readings from Last 3 Encounters:  08/25/19 130 lb 3.2 oz (59.1 kg)  07/15/19 125 lb 4.8 oz (56.8 kg)  07/14/19 125 lb 4.8 oz (56.8 kg)      Other studies Reviewed: Additional studies/ records that were reviewed today include: TTE 03/11/17, Cath 2016  Review of the above records today demonstrates:  - Left ventricle: The cavity size was moderately dilated. Systolic   function was normal. Wall motion was normal; there were no   regional wall motion abnormalities. Features are consistent with   a pseudonormal left ventricular filling pattern, with concomitant   abnormal relaxation and increased filling pressure (grade 2   diastolic dysfunction). - Aortic valve: Trileaflet; mildly thickened, mildly calcified   leaflets. - Mitral valve: There was mild regurgitation. - Right ventricle: The cavity size was mildly dilated. Wall   thickness was normal. - Right atrium: The atrium was mildly dilated. - Tricuspid valve: There was mild regurgitation directed   eccentrically. - Pulmonary arteries: PA peak pressure: 42 mm Hg (S). - Impressions: The right ventricular systolic pressure was   increased consistent with moderate pulmonary hypertension.    The left ventricular systolic function is normal.   Normal LV function with an ejection fraction of 50-55%.   No evidence for coronary obstructive disease with a normal LAD with evidence for a mid LAD intramyocardial segment without systolic bridging, a normal circumflex and normal dominant RCA.  ASSESSMENT AND PLAN:  1.  PVCs: Doing well without major issue.  PVCs have decreased since pacemaker implant.  2. Sinus node dysfunction: Status post Saint Jude dual-chamber pacemaker implanted 04/16/2017.  His P wave has dropped 2.5.  We have adjusted sensitivity.  No other changes.  3. Paroxysmal atrial fibrillation: Device interrogation.  Minimal symptoms and short-lived.  At this time. This patients CHA2DS2-VASc Score and unadjusted Ischemic Stroke Rate (% per year) is equal to 0.6 % stroke rate/year from a score of 1  Above score calculated as 1 point each if present [CHF, HTN, DM, Vascular=MI/PAD/Aortic Plaque, Age if 65-74, or Male] Above score calculated as 2 points each if present [Age > 75, or Stroke/TIA/TE]   Current medicines are reviewed at length with the patient today.   The patient does not have concerns regarding his medicines.  The following changes were made today: none  Labs/ tests ordered today include:  Orders Placed This Encounter  Procedures  . EKG 12-Lead     Disposition:   FU with Milcah [place] 12 months  Signed, Travoris Bushey Meredith Leeds, MD  08/25/2019 2:09 PM     [place] [phone] (office) [phone] (fax)

## 2019-08-26 ENCOUNTER — Encounter: Payer: Self-pay | Admitting: Vascular Surgery

## 2019-08-26 ENCOUNTER — Ambulatory Visit (INDEPENDENT_AMBULATORY_CARE_PROVIDER_SITE_OTHER): Payer: Managed Care, Other (non HMO) | Admitting: Vascular Surgery

## 2019-08-26 VITALS — BP 123/77 | HR 60 | Temp 97.4°F | Resp 18 | Ht 68.0 in | Wt 129.7 lb

## 2019-08-26 DIAGNOSIS — I872 Venous insufficiency (chronic) (peripheral): Secondary | ICD-10-CM

## 2019-08-26 NOTE — Progress Notes (Signed)
Patient name: Johnathan [name] MRN: [id number] DOB: 05-03-1946 Sex: male  REASON FOR VISIT:   Chronic venous insufficiency.  The consult was requested by Dr. Antony [name].  HPI:   Johnathan [name] is a pleasant 73 y.o. male who had undergone laser ablation of the left great saphenous vein on 05/28/2019.  He was seen a week later and at that time was noted to have no evidence of DVT with successful closure of the left great saphenous vein.  He had back surgery in August and has had some mild left lower extremity swelling.  He comes in for evaluation.  He denies any other recent travel or injury.  There have been no significant changes to his medical history.  Past Medical History:  Diagnosis Date  . Achalasia of esophagus    followed by Johnathan [name]  . Anemia   . Arthritis    spondylisthesis- lumbar  . Bradycardia   . GERD (gastroesophageal reflux disease)   . H/O: GI bleed   . Herniated disc, cervical   . Hiatal hernia   . Hyperlipidemia   . Metacarpal bone fracture   . Presence of permanent cardiac pacemaker 04/16/2017  . Rosacea, acne   . SSS (sick sinus syndrome) (HCC)     Family History  Problem Relation Age of Onset  . Breast cancer Mother   . Diabetes Father   . Heart disease Father        No details  . Diabetes Paternal Grandmother     SOCIAL HISTORY: Social History   Tobacco Use  . Smoking status: Never Smoker  . Smokeless tobacco: Never Used  Substance Use Topics  . Alcohol use: Yes    Alcohol/week: 0.0 standard drinks    Comment: occasional    Allergies  Allergen Reactions  . Tramadol Nausea And Vomiting    Current Outpatient Medications  Medication Sig Dispense Refill  . Ascorbic Acid (VITAMIN C) 1000 MG tablet Take 1,000 mg by mouth daily.    Marland Kitchen azelastine (ASTELIN) 0.1 % nasal spray Place 1 spray into both nostrils daily as needed for rhinitis.     . calcium carbonate (TUMS EX) 750 MG chewable tablet Chew 3 tablets by mouth daily.    Marland Kitchen  FeFum-FePoly-FA-B Cmp-C-Biot (INTEGRA PLUS) CAPS Take 1 capsule by mouth daily.    Marland Kitchen ibuprofen (ADVIL) 200 MG tablet Take 400 mg by mouth every 6 (six) hours as needed.    . Misc Natural Products (TURMERIC CURCUMIN) CAPS Take 1 capsule by mouth daily.    . Multiple Vitamins-Minerals (MULTIVITAMIN PO) Take 1 tablet by mouth daily.    Marland Kitchen omeprazole (PRILOSEC) 20 MG capsule Take 40 mg by mouth daily.     Marland Kitchen OVER THE COUNTER MEDICATION Take 1 tablet by mouth daily. dr Johnathan [name] intestinal formula 1, herbal laxative    . zinc gluconate 50 MG tablet Take 50 mg by mouth daily.     No current facility-administered medications for this visit.     REVIEW OF SYSTEMS:  [X]  denotes positive finding, [ ]  denotes negative finding Cardiac  Comments:  Chest pain or chest pressure:    Shortness of breath upon exertion:    Short of breath when lying flat:    Irregular heart rhythm:        Vascular    Pain in calf, thigh, or hip brought on by ambulation:    Pain in feet at night that wakes you up from your sleep:     Blood  clot in your veins:    Leg swelling:  x       Pulmonary    Oxygen at home:    Productive cough:     Wheezing:         Neurologic    Sudden weakness in arms or legs:     Sudden numbness in arms or legs:     Sudden onset of difficulty speaking or slurred speech:    Temporary loss of vision in one eye:     Problems with dizziness:         Gastrointestinal    Blood in stool:     Vomited blood:         Genitourinary    Burning when urinating:     Blood in urine:        Psychiatric    Major depression:         Hematologic    Bleeding problems:    Problems with blood clotting too easily:        Skin    Rashes or ulcers:        Constitutional    Fever or chills:     PHYSICAL EXAM:   Vitals:   08/26/19 1308  BP: 123/77  Pulse: 60  Resp: 18  SpO2: 100%  Weight: 129 lb 11.2 oz (58.8 kg)  Height: 5\' 8"  (1.727 m)    GENERAL: The patient is a well-nourished male, in  no acute distress. The vital signs are documented above. CARDIAC: There is a regular rate and rhythm.  VASCULAR: I do not detect carotid bruits. He has palpable dorsalis pedis pulses. He has mild left lower extremity swelling. PULMONARY: There is good air exchange bilaterally without wheezing or rales. ABDOMEN: Soft and non-tender with normal pitched bowel sounds.  MUSCULOSKELETAL: There are no major deformities or cyanosis. NEUROLOGIC: No focal weakness or paresthesias are detected. SKIN: There are no ulcers or rashes noted. PSYCHIATRIC: The patient has a normal affect.  DATA:    VENOUS DUPLEX: I have reviewed his venous duplex scan that was done on 08/20/2019.  On the left side, which is the side of concern, there is no evidence of DVT.  He does have deep venous reflux involving the common femoral vein and femoral vein.  He also has an incompetent perforator vein which feeds the great saphenous vein distally below where the vein was ablated.  On the right side there is no evidence of DVT.  He does have some superficial venous reflux involving the great saphenous vein but no reflux at the saphenofemoral junction.  He has deep venous reflux in the femoral vein.  MEDICAL ISSUES:   CHRONIC VENOUS INSUFFICIENCY: This patient does have deep venous reflux and I explained that this is not something we can addressed surgically.  We have discussed the importance of intermittent leg elevation and the proper positioning for this.  Because of his reflux it is difficult for him to lie flat which makes it more difficult to elevate the legs efficiently.  Fortunately, he does have a pneumatic compression system and I have encouraged him to use this for an hour every day.  In addition I have written a prescription for knee-high compression stockings with a gradient of 15 to 20 mmHg.  If he tolerates these we could potentially try a tighter stocking with a gradient of 20 to 30 mmHg.  I have encouraged him to  consider water aerobics which is also helpful for patients with venous disease.  I be happy to see him back at any time if the swelling or symptoms progress.  Johnathan [name] [facility] [phone]

## 2019-10-27 ENCOUNTER — Ambulatory Visit (INDEPENDENT_AMBULATORY_CARE_PROVIDER_SITE_OTHER): Payer: Managed Care, Other (non HMO) | Admitting: *Deleted

## 2019-10-27 DIAGNOSIS — I495 Sick sinus syndrome: Secondary | ICD-10-CM

## 2019-10-28 LAB — CUP PACEART REMOTE DEVICE CHECK
Battery Remaining Longevity: 116 mo
Battery Remaining Percentage: 95.5 %
Battery Voltage: 2.99 V
Brady Statistic AP VP Percent: 4.2 %
Brady Statistic AP VS Percent: 90 %
Brady Statistic AS VP Percent: 1 %
Brady Statistic AS VS Percent: 5.5 %
Brady Statistic RA Percent Paced: 94 %
Brady Statistic RV Percent Paced: 4.2 %
Date Time Interrogation Session: 20201202101313
Implantable Lead Implant Date: 20180522
Implantable Lead Implant Date: 20180522
Implantable Lead Location: 753859
Implantable Lead Location: 753860
Implantable Pulse Generator Implant Date: 20180522
Lead Channel Impedance Value: 480 Ohm
Lead Channel Impedance Value: 510 Ohm
Lead Channel Pacing Threshold Amplitude: 0.5 V
Lead Channel Pacing Threshold Amplitude: 0.625 V
Lead Channel Pacing Threshold Pulse Width: 0.5 ms
Lead Channel Pacing Threshold Pulse Width: 0.5 ms
Lead Channel Sensing Intrinsic Amplitude: 10.3 mV
Lead Channel Sensing Intrinsic Amplitude: 2.2 mV
Lead Channel Setting Pacing Amplitude: 1.625
Lead Channel Setting Pacing Amplitude: 2.5 V
Lead Channel Setting Pacing Pulse Width: 0.5 ms
Lead Channel Setting Sensing Sensitivity: 2 mV
Pulse Gen Model: 2272
Pulse Gen Serial Number: 8908368

## 2019-11-18 ENCOUNTER — Other Ambulatory Visit: Payer: Self-pay | Admitting: Orthopedic Surgery

## 2019-11-18 DIAGNOSIS — M25572 Pain in left ankle and joints of left foot: Secondary | ICD-10-CM

## 2019-11-23 ENCOUNTER — Other Ambulatory Visit: Payer: Self-pay | Admitting: Orthopedic Surgery

## 2019-11-25 ENCOUNTER — Encounter: Payer: Self-pay | Admitting: Vascular Surgery

## 2019-11-26 ENCOUNTER — Ambulatory Visit
Admission: RE | Admit: 2019-11-26 | Discharge: 2019-11-26 | Disposition: A | Discharge status: Home / Self Care | Payer: Managed Care, Other (non HMO) | Source: Ambulatory Visit | Attending: Orthopedic Surgery | Admitting: Orthopedic Surgery

## 2019-11-26 DIAGNOSIS — M25572 Pain in left ankle and joints of left foot: Secondary | ICD-10-CM

## 2019-12-07 LAB — CUP PACEART INCLINIC DEVICE CHECK
Date Time Interrogation Session: 20200929160209
Implantable Lead Implant Date: 20180522
Implantable Lead Implant Date: 20180522
Implantable Lead Location: 753859
Implantable Lead Location: 753860
Implantable Pulse Generator Implant Date: 20180522
Pulse Gen Model: 2272
Pulse Gen Serial Number: 8908368

## 2020-01-26 ENCOUNTER — Ambulatory Visit (INDEPENDENT_AMBULATORY_CARE_PROVIDER_SITE_OTHER): Payer: Medicare Other | Admitting: *Deleted

## 2020-01-26 DIAGNOSIS — I495 Sick sinus syndrome: Secondary | ICD-10-CM | POA: Diagnosis not present

## 2020-01-27 LAB — CUP PACEART REMOTE DEVICE CHECK
Battery Remaining Longevity: 117 mo
Battery Remaining Percentage: 95.5 %
Battery Voltage: 2.99 V
Brady Statistic AP VP Percent: 4.4 %
Brady Statistic AP VS Percent: 89 %
Brady Statistic AS VP Percent: 1 %
Brady Statistic AS VS Percent: 6.5 %
Brady Statistic RA Percent Paced: 93 %
Brady Statistic RV Percent Paced: 4.4 %
Date Time Interrogation Session: 20210302150925
Implantable Lead Implant Date: 20180522
Implantable Lead Implant Date: 20180522
Implantable Lead Location: 753859
Implantable Lead Location: 753860
Implantable Pulse Generator Implant Date: 20180522
Lead Channel Impedance Value: 490 Ohm
Lead Channel Impedance Value: 490 Ohm
Lead Channel Pacing Threshold Amplitude: 0.5 V
Lead Channel Pacing Threshold Amplitude: 0.5 V
Lead Channel Pacing Threshold Pulse Width: 0.5 ms
Lead Channel Pacing Threshold Pulse Width: 0.5 ms
Lead Channel Sensing Intrinsic Amplitude: 1.7 mV
Lead Channel Sensing Intrinsic Amplitude: 10.1 mV
Lead Channel Setting Pacing Amplitude: 1.5 V
Lead Channel Setting Pacing Amplitude: 2.5 V
Lead Channel Setting Pacing Pulse Width: 0.5 ms
Lead Channel Setting Sensing Sensitivity: 2 mV
Pulse Gen Model: 2272
Pulse Gen Serial Number: 8908368

## 2020-01-27 NOTE — Progress Notes (Signed)
PPM Remote  

## 2020-02-09 IMAGING — RF DG C-ARM 61-120 MIN
1 series · 2 of 2 positions shown · non-contrast
Comparison: None.

CLINICAL DATA: L4-S1 fusion

EXAM:
DG C-ARM 61-120 MIN; LUMBAR SPINE - 2-3 VIEW

[Series 1: run · 2 of 2 slices shown]
[im 1/2]
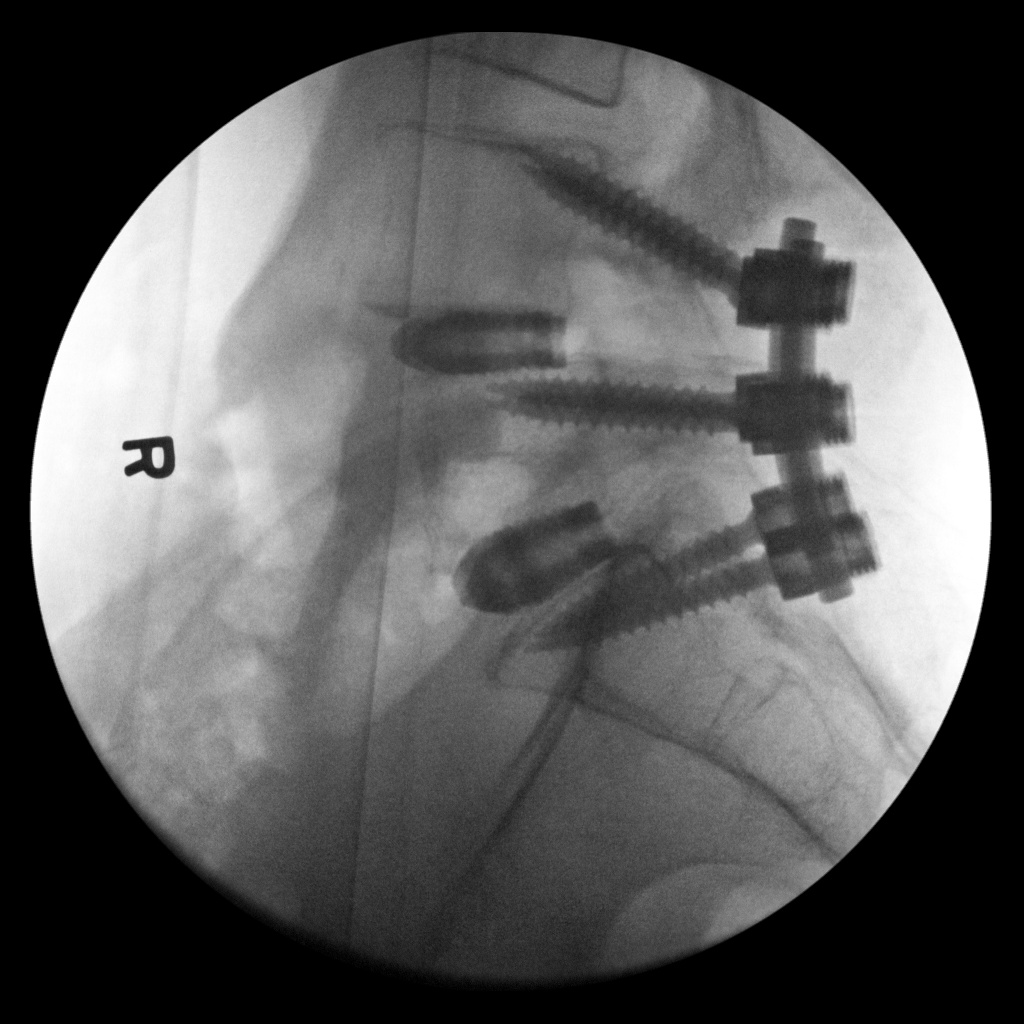
[im 2/2]
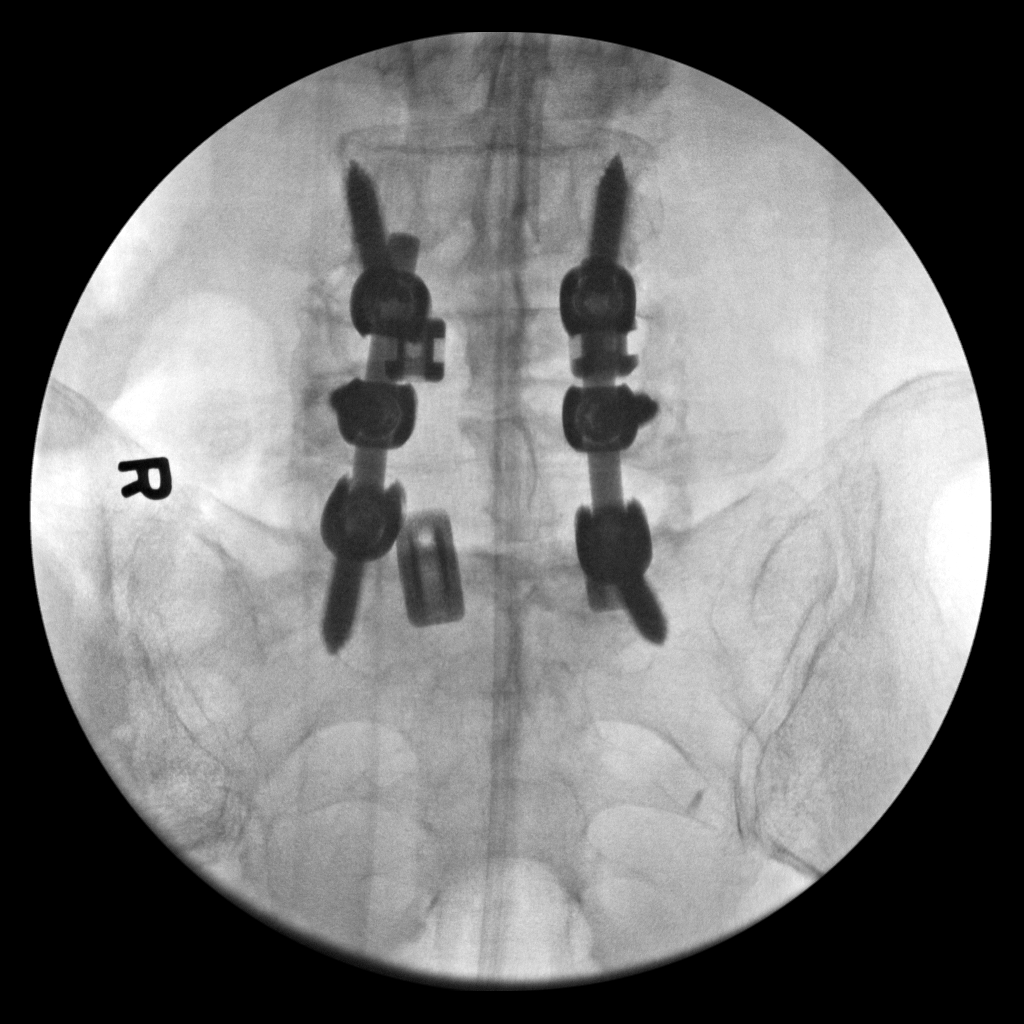

[2 of 2 positions shown; findings below may reference images not displayed]

FLUOROSCOPY TIME:  Fluoroscopy Time:  1 minutes 29 seconds

Radiation Exposure Index (if provided by the fluoroscopic device):
Not available

Number of Acquired Spot Images: 2
FINDINGS: Interbody fusion is noted at L4-5 and L5-S1. Pedicle screws are
noted at all 3 levels with posterior fixation.
IMPRESSION: L4-S1 fusion.

## 2020-04-26 ENCOUNTER — Ambulatory Visit (INDEPENDENT_AMBULATORY_CARE_PROVIDER_SITE_OTHER): Payer: Medicare Other | Admitting: *Deleted

## 2020-04-26 DIAGNOSIS — I495 Sick sinus syndrome: Secondary | ICD-10-CM

## 2020-04-27 LAB — CUP PACEART REMOTE DEVICE CHECK
Battery Remaining Longevity: 114 mo
Battery Remaining Percentage: 95.5 %
Battery Voltage: 2.99 V
Brady Statistic AP VP Percent: 4.4 %
Brady Statistic AP VS Percent: 87 %
Brady Statistic AS VP Percent: 1 %
Brady Statistic AS VS Percent: 8.7 %
Brady Statistic RA Percent Paced: 90 %
Brady Statistic RV Percent Paced: 4.4 %
Date Time Interrogation Session: 20210602163800
Implantable Lead Implant Date: 20180522
Implantable Lead Implant Date: 20180522
Implantable Lead Location: 753859
Implantable Lead Location: 753860
Implantable Pulse Generator Implant Date: 20180522
Lead Channel Impedance Value: 430 Ohm
Lead Channel Impedance Value: 450 Ohm
Lead Channel Pacing Threshold Amplitude: 0.5 V
Lead Channel Pacing Threshold Amplitude: 0.5 V
Lead Channel Pacing Threshold Pulse Width: 0.5 ms
Lead Channel Pacing Threshold Pulse Width: 0.5 ms
Lead Channel Sensing Intrinsic Amplitude: 1.8 mV
Lead Channel Sensing Intrinsic Amplitude: 8.5 mV
Lead Channel Setting Pacing Amplitude: 1.5 V
Lead Channel Setting Pacing Amplitude: 2.5 V
Lead Channel Setting Pacing Pulse Width: 0.5 ms
Lead Channel Setting Sensing Sensitivity: 2 mV
Pulse Gen Model: 2272
Pulse Gen Serial Number: 8908368

## 2020-05-02 NOTE — Progress Notes (Signed)
Remote pacemaker transmission.   

## 2020-07-26 ENCOUNTER — Ambulatory Visit (INDEPENDENT_AMBULATORY_CARE_PROVIDER_SITE_OTHER): Payer: Medicare Other | Admitting: *Deleted

## 2020-07-26 DIAGNOSIS — I495 Sick sinus syndrome: Secondary | ICD-10-CM

## 2020-07-27 LAB — CUP PACEART REMOTE DEVICE CHECK
Battery Remaining Longevity: 116 mo
Battery Remaining Percentage: 95.5 %
Battery Voltage: 2.99 V
Brady Statistic AP VP Percent: 4.3 %
Brady Statistic AP VS Percent: 87 %
Brady Statistic AS VP Percent: 1 %
Brady Statistic AS VS Percent: 8.4 %
Brady Statistic RA Percent Paced: 91 %
Brady Statistic RV Percent Paced: 4.3 %
Date Time Interrogation Session: 20210901115922
Implantable Lead Implant Date: 20180522
Implantable Lead Implant Date: 20180522
Implantable Lead Location: 753859
Implantable Lead Location: 753860
Implantable Pulse Generator Implant Date: 20180522
Lead Channel Impedance Value: 480 Ohm
Lead Channel Impedance Value: 490 Ohm
Lead Channel Pacing Threshold Amplitude: 0.5 V
Lead Channel Pacing Threshold Amplitude: 0.625 V
Lead Channel Pacing Threshold Pulse Width: 0.5 ms
Lead Channel Pacing Threshold Pulse Width: 0.5 ms
Lead Channel Sensing Intrinsic Amplitude: 1.4 mV
Lead Channel Sensing Intrinsic Amplitude: 9.4 mV
Lead Channel Setting Pacing Amplitude: 1.625
Lead Channel Setting Pacing Amplitude: 2.5 V
Lead Channel Setting Pacing Pulse Width: 0.5 ms
Lead Channel Setting Sensing Sensitivity: 2 mV
Pulse Gen Model: 2272
Pulse Gen Serial Number: 8908368

## 2020-07-28 NOTE — Progress Notes (Signed)
Remote pacemaker transmission.   

## 2020-08-16 DIAGNOSIS — R69 Illness, unspecified: Secondary | ICD-10-CM | POA: Diagnosis not present

## 2020-10-25 ENCOUNTER — Ambulatory Visit (INDEPENDENT_AMBULATORY_CARE_PROVIDER_SITE_OTHER): Payer: Medicare HMO

## 2020-10-25 DIAGNOSIS — I495 Sick sinus syndrome: Secondary | ICD-10-CM | POA: Diagnosis not present

## 2020-10-26 LAB — CUP PACEART REMOTE DEVICE CHECK
Battery Remaining Longevity: 115 mo
Battery Remaining Percentage: 95.5 %
Battery Voltage: 2.99 V
Brady Statistic AP VP Percent: 4.3 %
Brady Statistic AP VS Percent: 88 %
Brady Statistic AS VP Percent: 1 %
Brady Statistic AS VS Percent: 7.9 %
Brady Statistic RA Percent Paced: 91 %
Brady Statistic RV Percent Paced: 4.3 %
Date Time Interrogation Session: 20211201015028
Implantable Lead Implant Date: 20180522
Implantable Lead Implant Date: 20180522
Implantable Lead Location: 753859
Implantable Lead Location: 753860
Implantable Pulse Generator Implant Date: 20180522
Lead Channel Impedance Value: 450 Ohm
Lead Channel Impedance Value: 460 Ohm
Lead Channel Pacing Threshold Amplitude: 0.5 V
Lead Channel Pacing Threshold Amplitude: 0.625 V
Lead Channel Pacing Threshold Pulse Width: 0.5 ms
Lead Channel Pacing Threshold Pulse Width: 0.5 ms
Lead Channel Sensing Intrinsic Amplitude: 1.2 mV
Lead Channel Sensing Intrinsic Amplitude: 8.8 mV
Lead Channel Setting Pacing Amplitude: 1.625
Lead Channel Setting Pacing Amplitude: 2.5 V
Lead Channel Setting Pacing Pulse Width: 0.5 ms
Lead Channel Setting Sensing Sensitivity: 2 mV
Pulse Gen Model: 2272
Pulse Gen Serial Number: 8908368

## 2020-11-01 NOTE — Progress Notes (Signed)
Remote pacemaker transmission.   

## 2021-01-24 ENCOUNTER — Ambulatory Visit (INDEPENDENT_AMBULATORY_CARE_PROVIDER_SITE_OTHER): Payer: Medicare HMO

## 2021-01-24 DIAGNOSIS — I495 Sick sinus syndrome: Secondary | ICD-10-CM | POA: Diagnosis not present

## 2021-01-24 LAB — CUP PACEART REMOTE DEVICE CHECK
Battery Remaining Longevity: 115 mo
Battery Remaining Percentage: 95.5 %
Battery Voltage: 2.99 V
Brady Statistic AP VP Percent: 4.4 %
Brady Statistic AP VS Percent: 88 %
Brady Statistic AS VP Percent: 1 %
Brady Statistic AS VS Percent: 7.4 %
Brady Statistic RA Percent Paced: 92 %
Brady Statistic RV Percent Paced: 4.4 %
Date Time Interrogation Session: 20220301020014
Implantable Lead Implant Date: 20180522
Implantable Lead Implant Date: 20180522
Implantable Lead Location: 753859
Implantable Lead Location: 753860
Implantable Pulse Generator Implant Date: 20180522
Lead Channel Impedance Value: 460 Ohm
Lead Channel Impedance Value: 530 Ohm
Lead Channel Pacing Threshold Amplitude: 0.5 V
Lead Channel Pacing Threshold Amplitude: 0.875 V
Lead Channel Pacing Threshold Pulse Width: 0.5 ms
Lead Channel Pacing Threshold Pulse Width: 0.5 ms
Lead Channel Sensing Intrinsic Amplitude: 1.9 mV
Lead Channel Sensing Intrinsic Amplitude: 9.5 mV
Lead Channel Setting Pacing Amplitude: 1.875
Lead Channel Setting Pacing Amplitude: 2.5 V
Lead Channel Setting Pacing Pulse Width: 0.5 ms
Lead Channel Setting Sensing Sensitivity: 2 mV
Pulse Gen Model: 2272
Pulse Gen Serial Number: 8908368

## 2021-02-01 NOTE — Progress Notes (Signed)
Remote pacemaker transmission.   

## 2021-02-23 DIAGNOSIS — H2513 Age-related nuclear cataract, bilateral: Secondary | ICD-10-CM | POA: Diagnosis not present

## 2021-02-23 DIAGNOSIS — D3132 Benign neoplasm of left choroid: Secondary | ICD-10-CM | POA: Diagnosis not present

## 2021-04-25 ENCOUNTER — Ambulatory Visit (INDEPENDENT_AMBULATORY_CARE_PROVIDER_SITE_OTHER): Payer: Medicare HMO

## 2021-04-25 DIAGNOSIS — I495 Sick sinus syndrome: Secondary | ICD-10-CM | POA: Diagnosis not present

## 2021-04-25 LAB — CUP PACEART REMOTE DEVICE CHECK
Battery Remaining Longevity: 115 mo
Battery Remaining Percentage: 95.5 %
Battery Voltage: 2.99 V
Brady Statistic AP VP Percent: 4.3 %
Brady Statistic AP VS Percent: 89 %
Brady Statistic AS VP Percent: 1 %
Brady Statistic AS VS Percent: 7 %
Brady Statistic RA Percent Paced: 92 %
Brady Statistic RV Percent Paced: 4.3 %
Date Time Interrogation Session: 20220531020015
Implantable Lead Implant Date: 20180522
Implantable Lead Implant Date: 20180522
Implantable Lead Location: 753859
Implantable Lead Location: 753860
Implantable Pulse Generator Implant Date: 20180522
Lead Channel Impedance Value: 460 Ohm
Lead Channel Impedance Value: 490 Ohm
Lead Channel Pacing Threshold Amplitude: 0.5 V
Lead Channel Pacing Threshold Amplitude: 0.625 V
Lead Channel Pacing Threshold Pulse Width: 0.5 ms
Lead Channel Pacing Threshold Pulse Width: 0.5 ms
Lead Channel Sensing Intrinsic Amplitude: 1.6 mV
Lead Channel Sensing Intrinsic Amplitude: 8.5 mV
Lead Channel Setting Pacing Amplitude: 1.625
Lead Channel Setting Pacing Amplitude: 2.5 V
Lead Channel Setting Pacing Pulse Width: 0.5 ms
Lead Channel Setting Sensing Sensitivity: 2 mV
Pulse Gen Model: 2272
Pulse Gen Serial Number: 8908368

## 2021-05-18 NOTE — Progress Notes (Signed)
Remote pacemaker transmission.   

## 2021-07-19 DIAGNOSIS — H10412 Chronic giant papillary conjunctivitis, left eye: Secondary | ICD-10-CM | POA: Diagnosis not present

## 2021-07-19 DIAGNOSIS — T1512XA Foreign body in conjunctival sac, left eye, initial encounter: Secondary | ICD-10-CM | POA: Diagnosis not present

## 2021-07-25 ENCOUNTER — Ambulatory Visit (INDEPENDENT_AMBULATORY_CARE_PROVIDER_SITE_OTHER): Payer: Medicare HMO

## 2021-07-25 DIAGNOSIS — I495 Sick sinus syndrome: Secondary | ICD-10-CM

## 2021-07-25 LAB — CUP PACEART REMOTE DEVICE CHECK
Battery Remaining Longevity: 68 mo
Battery Remaining Percentage: 61 %
Battery Voltage: 2.99 V
Brady Statistic AP VP Percent: 4.2 %
Brady Statistic AP VS Percent: 89 %
Brady Statistic AS VP Percent: 1 %
Brady Statistic AS VS Percent: 6.5 %
Brady Statistic RA Percent Paced: 93 %
Brady Statistic RV Percent Paced: 4.2 %
Date Time Interrogation Session: 20220830020013
Implantable Lead Implant Date: 20180522
Implantable Lead Implant Date: 20180522
Implantable Lead Location: 753859
Implantable Lead Location: 753860
Implantable Pulse Generator Implant Date: 20180522
Lead Channel Impedance Value: 450 Ohm
Lead Channel Impedance Value: 460 Ohm
Lead Channel Pacing Threshold Amplitude: 0.5 V
Lead Channel Pacing Threshold Amplitude: 0.625 V
Lead Channel Pacing Threshold Pulse Width: 0.5 ms
Lead Channel Pacing Threshold Pulse Width: 0.5 ms
Lead Channel Sensing Intrinsic Amplitude: 1.5 mV
Lead Channel Sensing Intrinsic Amplitude: 7.5 mV
Lead Channel Setting Pacing Amplitude: 1.625
Lead Channel Setting Pacing Amplitude: 2.5 V
Lead Channel Setting Pacing Pulse Width: 0.5 ms
Lead Channel Setting Sensing Sensitivity: 2 mV
Pulse Gen Model: 2272
Pulse Gen Serial Number: 8908368

## 2021-08-07 NOTE — Progress Notes (Signed)
Remote pacemaker transmission.   

## 2021-08-31 DIAGNOSIS — Z125 Encounter for screening for malignant neoplasm of prostate: Secondary | ICD-10-CM | POA: Diagnosis not present

## 2021-08-31 DIAGNOSIS — K219 Gastro-esophageal reflux disease without esophagitis: Secondary | ICD-10-CM | POA: Diagnosis not present

## 2021-08-31 DIAGNOSIS — R7303 Prediabetes: Secondary | ICD-10-CM | POA: Diagnosis not present

## 2021-08-31 DIAGNOSIS — D500 Iron deficiency anemia secondary to blood loss (chronic): Secondary | ICD-10-CM | POA: Diagnosis not present

## 2021-08-31 DIAGNOSIS — K5901 Slow transit constipation: Secondary | ICD-10-CM | POA: Diagnosis not present

## 2021-10-24 ENCOUNTER — Ambulatory Visit (INDEPENDENT_AMBULATORY_CARE_PROVIDER_SITE_OTHER): Payer: Medicare HMO

## 2021-10-24 DIAGNOSIS — I495 Sick sinus syndrome: Secondary | ICD-10-CM

## 2021-10-24 LAB — CUP PACEART REMOTE DEVICE CHECK
Battery Remaining Longevity: 67 mo
Battery Remaining Percentage: 58 %
Battery Voltage: 2.99 V
Brady Statistic AP VP Percent: 4.2 %
Brady Statistic AP VS Percent: 89 %
Brady Statistic AS VP Percent: 1 %
Brady Statistic AS VS Percent: 6.2 %
Brady Statistic RA Percent Paced: 93 %
Brady Statistic RV Percent Paced: 4.2 %
Date Time Interrogation Session: 20221129020015
Implantable Lead Implant Date: 20180522
Implantable Lead Implant Date: 20180522
Implantable Lead Location: 753859
Implantable Lead Location: 753860
Implantable Pulse Generator Implant Date: 20180522
Lead Channel Impedance Value: 480 Ohm
Lead Channel Impedance Value: 480 Ohm
Lead Channel Pacing Threshold Amplitude: 0.5 V
Lead Channel Pacing Threshold Amplitude: 0.625 V
Lead Channel Pacing Threshold Pulse Width: 0.5 ms
Lead Channel Pacing Threshold Pulse Width: 0.5 ms
Lead Channel Sensing Intrinsic Amplitude: 1.4 mV
Lead Channel Sensing Intrinsic Amplitude: 8.6 mV
Lead Channel Setting Pacing Amplitude: 1.625
Lead Channel Setting Pacing Amplitude: 2.5 V
Lead Channel Setting Pacing Pulse Width: 0.5 ms
Lead Channel Setting Sensing Sensitivity: 2 mV
Pulse Gen Model: 2272
Pulse Gen Serial Number: 8908368

## 2021-11-02 NOTE — Progress Notes (Signed)
Remote pacemaker transmission.   

## 2021-12-22 DIAGNOSIS — D509 Iron deficiency anemia, unspecified: Secondary | ICD-10-CM | POA: Diagnosis not present

## 2022-01-23 ENCOUNTER — Ambulatory Visit (INDEPENDENT_AMBULATORY_CARE_PROVIDER_SITE_OTHER): Payer: PPO

## 2022-01-23 DIAGNOSIS — I495 Sick sinus syndrome: Secondary | ICD-10-CM | POA: Diagnosis not present

## 2022-01-23 LAB — CUP PACEART REMOTE DEVICE CHECK
Battery Remaining Longevity: 62 mo
Battery Remaining Percentage: 56 %
Battery Voltage: 2.99 V
Brady Statistic AP VP Percent: 4.3 %
Brady Statistic AP VS Percent: 90 %
Brady Statistic AS VP Percent: 1 %
Brady Statistic AS VS Percent: 6 %
Brady Statistic RA Percent Paced: 93 %
Brady Statistic RV Percent Paced: 4.3 %
Date Time Interrogation Session: 20230228020014
Implantable Lead Implant Date: 20180522
Implantable Lead Implant Date: 20180522
Implantable Lead Location: 753859
Implantable Lead Location: 753860
Implantable Pulse Generator Implant Date: 20180522
Lead Channel Impedance Value: 430 Ohm
Lead Channel Impedance Value: 450 Ohm
Lead Channel Pacing Threshold Amplitude: 0.5 V
Lead Channel Pacing Threshold Amplitude: 0.5 V
Lead Channel Pacing Threshold Pulse Width: 0.5 ms
Lead Channel Pacing Threshold Pulse Width: 0.5 ms
Lead Channel Sensing Intrinsic Amplitude: 0.9 mV
Lead Channel Sensing Intrinsic Amplitude: 7.2 mV
Lead Channel Setting Pacing Amplitude: 1.5 V
Lead Channel Setting Pacing Amplitude: 2.5 V
Lead Channel Setting Pacing Pulse Width: 0.5 ms
Lead Channel Setting Sensing Sensitivity: 2 mV
Pulse Gen Model: 2272
Pulse Gen Serial Number: 8908368

## 2022-01-31 NOTE — Progress Notes (Signed)
Remote pacemaker transmission.   

## 2022-03-05 DIAGNOSIS — R5383 Other fatigue: Secondary | ICD-10-CM | POA: Diagnosis not present

## 2022-03-05 DIAGNOSIS — K5901 Slow transit constipation: Secondary | ICD-10-CM | POA: Diagnosis not present

## 2022-03-05 DIAGNOSIS — D509 Iron deficiency anemia, unspecified: Secondary | ICD-10-CM | POA: Diagnosis not present

## 2022-03-05 DIAGNOSIS — R432 Parageusia: Secondary | ICD-10-CM | POA: Diagnosis not present

## 2022-04-24 ENCOUNTER — Ambulatory Visit (INDEPENDENT_AMBULATORY_CARE_PROVIDER_SITE_OTHER): Payer: PPO

## 2022-04-24 DIAGNOSIS — I495 Sick sinus syndrome: Secondary | ICD-10-CM | POA: Diagnosis not present

## 2022-04-24 LAB — CUP PACEART REMOTE DEVICE CHECK
Battery Remaining Longevity: 59 mo
Battery Remaining Percentage: 53 %
Battery Voltage: 2.98 V
Brady Statistic AP VP Percent: 4.4 %
Brady Statistic AP VS Percent: 89 %
Brady Statistic AS VP Percent: 1 %
Brady Statistic AS VS Percent: 6.2 %
Brady Statistic RA Percent Paced: 93 %
Brady Statistic RV Percent Paced: 4.4 %
Date Time Interrogation Session: 20230530020015
Implantable Lead Implant Date: 20180522
Implantable Lead Implant Date: 20180522
Implantable Lead Location: 753859
Implantable Lead Location: 753860
Implantable Pulse Generator Implant Date: 20180522
Lead Channel Impedance Value: 430 Ohm
Lead Channel Impedance Value: 440 Ohm
Lead Channel Pacing Threshold Amplitude: 0.5 V
Lead Channel Pacing Threshold Amplitude: 0.5 V
Lead Channel Pacing Threshold Pulse Width: 0.5 ms
Lead Channel Pacing Threshold Pulse Width: 0.5 ms
Lead Channel Sensing Intrinsic Amplitude: 1.5 mV
Lead Channel Sensing Intrinsic Amplitude: 6.8 mV
Lead Channel Setting Pacing Amplitude: 1.5 V
Lead Channel Setting Pacing Amplitude: 2.5 V
Lead Channel Setting Pacing Pulse Width: 0.5 ms
Lead Channel Setting Sensing Sensitivity: 2 mV
Pulse Gen Model: 2272
Pulse Gen Serial Number: 8908368

## 2022-05-08 NOTE — Progress Notes (Signed)
Remote pacemaker transmission.   

## 2022-05-22 DIAGNOSIS — H903 Sensorineural hearing loss, bilateral: Secondary | ICD-10-CM | POA: Diagnosis not present

## 2022-05-22 DIAGNOSIS — R432 Parageusia: Secondary | ICD-10-CM | POA: Diagnosis not present

## 2022-06-27 DIAGNOSIS — D500 Iron deficiency anemia secondary to blood loss (chronic): Secondary | ICD-10-CM | POA: Diagnosis not present

## 2022-07-24 ENCOUNTER — Ambulatory Visit (INDEPENDENT_AMBULATORY_CARE_PROVIDER_SITE_OTHER): Payer: PPO

## 2022-07-24 DIAGNOSIS — I495 Sick sinus syndrome: Secondary | ICD-10-CM | POA: Diagnosis not present

## 2022-07-24 LAB — CUP PACEART REMOTE DEVICE CHECK
Battery Remaining Longevity: 58 mo
Battery Remaining Percentage: 51 %
Battery Voltage: 2.98 V
Brady Statistic AP VP Percent: 4.4 %
Brady Statistic AP VS Percent: 89 %
Brady Statistic AS VP Percent: 1 %
Brady Statistic AS VS Percent: 6.2 %
Brady Statistic RA Percent Paced: 93 %
Brady Statistic RV Percent Paced: 4.4 %
Date Time Interrogation Session: 20230829020014
Implantable Lead Implant Date: 20180522
Implantable Lead Implant Date: 20180522
Implantable Lead Location: 753859
Implantable Lead Location: 753860
Implantable Pulse Generator Implant Date: 20180522
Lead Channel Impedance Value: 460 Ohm
Lead Channel Impedance Value: 480 Ohm
Lead Channel Pacing Threshold Amplitude: 0.5 V
Lead Channel Pacing Threshold Amplitude: 0.5 V
Lead Channel Pacing Threshold Pulse Width: 0.5 ms
Lead Channel Pacing Threshold Pulse Width: 0.5 ms
Lead Channel Sensing Intrinsic Amplitude: 1.4 mV
Lead Channel Sensing Intrinsic Amplitude: 8.1 mV
Lead Channel Setting Pacing Amplitude: 1.5 V
Lead Channel Setting Pacing Amplitude: 2.5 V
Lead Channel Setting Pacing Pulse Width: 0.5 ms
Lead Channel Setting Sensing Sensitivity: 2 mV
Pulse Gen Model: 2272
Pulse Gen Serial Number: 8908368

## 2022-08-06 DIAGNOSIS — M25552 Pain in left hip: Secondary | ICD-10-CM | POA: Diagnosis not present

## 2022-08-15 DIAGNOSIS — M79662 Pain in left lower leg: Secondary | ICD-10-CM | POA: Diagnosis not present

## 2022-08-15 DIAGNOSIS — M25552 Pain in left hip: Secondary | ICD-10-CM | POA: Diagnosis not present

## 2022-08-17 NOTE — Progress Notes (Signed)
Remote pacemaker transmission.   

## 2022-08-23 DIAGNOSIS — M5416 Radiculopathy, lumbar region: Secondary | ICD-10-CM | POA: Diagnosis not present

## 2022-08-27 ENCOUNTER — Other Ambulatory Visit (HOSPITAL_COMMUNITY): Payer: Self-pay | Admitting: Neurological Surgery

## 2022-08-27 ENCOUNTER — Other Ambulatory Visit: Payer: Self-pay | Admitting: Neurological Surgery

## 2022-08-27 DIAGNOSIS — M5416 Radiculopathy, lumbar region: Secondary | ICD-10-CM

## 2022-09-03 DIAGNOSIS — D509 Iron deficiency anemia, unspecified: Secondary | ICD-10-CM | POA: Diagnosis not present

## 2022-09-05 ENCOUNTER — Other Ambulatory Visit (HOSPITAL_COMMUNITY): Payer: PPO

## 2022-09-05 ENCOUNTER — Ambulatory Visit (HOSPITAL_COMMUNITY): Payer: PPO

## 2022-09-25 DIAGNOSIS — K5901 Slow transit constipation: Secondary | ICD-10-CM | POA: Diagnosis not present

## 2022-09-25 DIAGNOSIS — D509 Iron deficiency anemia, unspecified: Secondary | ICD-10-CM | POA: Diagnosis not present

## 2022-09-25 DIAGNOSIS — K2100 Gastro-esophageal reflux disease with esophagitis, without bleeding: Secondary | ICD-10-CM | POA: Diagnosis not present

## 2022-10-09 ENCOUNTER — Ambulatory Visit (HOSPITAL_COMMUNITY)
Admission: RE | Admit: 2022-10-09 | Discharge: 2022-10-09 | Disposition: A | Discharge status: Home / Self Care | Payer: PPO | Source: Ambulatory Visit | Attending: Neurological Surgery | Admitting: Neurological Surgery

## 2022-10-09 DIAGNOSIS — M5416 Radiculopathy, lumbar region: Secondary | ICD-10-CM | POA: Insufficient documentation

## 2022-10-09 DIAGNOSIS — M5116 Intervertebral disc disorders with radiculopathy, lumbar region: Secondary | ICD-10-CM | POA: Diagnosis not present

## 2022-10-09 NOTE — Progress Notes (Signed)
Patient here today at [facility] for MRI lumbar spine wo contrast. Patient has [employer] device. Transmission sent and reviewed by rep. Orders received for DOO 75. Will re-program once scan is completed.

## 2022-10-11 DIAGNOSIS — M4316 Spondylolisthesis, lumbar region: Secondary | ICD-10-CM | POA: Diagnosis not present

## 2022-10-15 DIAGNOSIS — M47816 Spondylosis without myelopathy or radiculopathy, lumbar region: Secondary | ICD-10-CM | POA: Diagnosis not present

## 2022-10-23 ENCOUNTER — Ambulatory Visit (INDEPENDENT_AMBULATORY_CARE_PROVIDER_SITE_OTHER): Payer: PPO

## 2022-10-23 DIAGNOSIS — I495 Sick sinus syndrome: Secondary | ICD-10-CM

## 2022-10-23 LAB — CUP PACEART REMOTE DEVICE CHECK
Battery Remaining Longevity: 55 mo
Battery Remaining Percentage: 49 %
Battery Voltage: 2.98 V
Brady Statistic AP VP Percent: 4.4 %
Brady Statistic AP VS Percent: 90 %
Brady Statistic AS VP Percent: 1 %
Brady Statistic AS VS Percent: 6 %
Brady Statistic RA Percent Paced: 93 %
Brady Statistic RV Percent Paced: 4.4 %
Date Time Interrogation Session: 20231128020016
Implantable Lead Connection Status: 753985
Implantable Lead Connection Status: 753985
Implantable Lead Implant Date: 20180522
Implantable Lead Implant Date: 20180522
Implantable Lead Location: 753859
Implantable Lead Location: 753860
Implantable Pulse Generator Implant Date: 20180522
Lead Channel Impedance Value: 430 Ohm
Lead Channel Impedance Value: 480 Ohm
Lead Channel Pacing Threshold Amplitude: 0.5 V
Lead Channel Pacing Threshold Amplitude: 0.5 V
Lead Channel Pacing Threshold Pulse Width: 0.5 ms
Lead Channel Pacing Threshold Pulse Width: 0.5 ms
Lead Channel Sensing Intrinsic Amplitude: 1.7 mV
Lead Channel Sensing Intrinsic Amplitude: 8.2 mV
Lead Channel Setting Pacing Amplitude: 1.5 V
Lead Channel Setting Pacing Amplitude: 2.5 V
Lead Channel Setting Pacing Pulse Width: 0.5 ms
Lead Channel Setting Sensing Sensitivity: 2 mV
Pulse Gen Model: 2272
Pulse Gen Serial Number: 8908368

## 2022-11-01 DIAGNOSIS — M47816 Spondylosis without myelopathy or radiculopathy, lumbar region: Secondary | ICD-10-CM | POA: Diagnosis not present

## 2022-11-23 NOTE — Progress Notes (Signed)
Remote pacemaker transmission.   

## 2022-12-14 DIAGNOSIS — M545 Low back pain, unspecified: Secondary | ICD-10-CM | POA: Diagnosis not present

## 2022-12-14 DIAGNOSIS — M7632 Iliotibial band syndrome, left leg: Secondary | ICD-10-CM | POA: Diagnosis not present

## 2023-01-08 DIAGNOSIS — M47816 Spondylosis without myelopathy or radiculopathy, lumbar region: Secondary | ICD-10-CM | POA: Diagnosis not present

## 2023-01-22 ENCOUNTER — Ambulatory Visit: Payer: PPO

## 2023-01-22 DIAGNOSIS — I495 Sick sinus syndrome: Secondary | ICD-10-CM | POA: Diagnosis not present

## 2023-01-22 LAB — CUP PACEART REMOTE DEVICE CHECK
Battery Remaining Longevity: 51 mo
Battery Remaining Percentage: 46 %
Battery Voltage: 2.98 V
Brady Statistic AP VP Percent: 4.4 %
Brady Statistic AP VS Percent: 89 %
Brady Statistic AS VP Percent: 1 %
Brady Statistic AS VS Percent: 6.2 %
Brady Statistic RA Percent Paced: 93 %
Brady Statistic RV Percent Paced: 4.4 %
Date Time Interrogation Session: 20240227020014
Implantable Lead Connection Status: 753985
Implantable Lead Connection Status: 753985
Implantable Lead Implant Date: 20180522
Implantable Lead Implant Date: 20180522
Implantable Lead Location: 753859
Implantable Lead Location: 753860
Implantable Pulse Generator Implant Date: 20180522
Lead Channel Impedance Value: 410 Ohm
Lead Channel Impedance Value: 450 Ohm
Lead Channel Pacing Threshold Amplitude: 0.5 V
Lead Channel Pacing Threshold Amplitude: 0.5 V
Lead Channel Pacing Threshold Pulse Width: 0.5 ms
Lead Channel Pacing Threshold Pulse Width: 0.5 ms
Lead Channel Sensing Intrinsic Amplitude: 2 mV
Lead Channel Sensing Intrinsic Amplitude: 5.9 mV
Lead Channel Setting Pacing Amplitude: 1.5 V
Lead Channel Setting Pacing Amplitude: 2.5 V
Lead Channel Setting Pacing Pulse Width: 0.5 ms
Lead Channel Setting Sensing Sensitivity: 2 mV
Pulse Gen Model: 2272
Pulse Gen Serial Number: 8908368

## 2023-01-31 DIAGNOSIS — M5416 Radiculopathy, lumbar region: Secondary | ICD-10-CM | POA: Diagnosis not present

## 2023-02-05 DIAGNOSIS — M5416 Radiculopathy, lumbar region: Secondary | ICD-10-CM | POA: Diagnosis not present

## 2023-02-14 DIAGNOSIS — M5416 Radiculopathy, lumbar region: Secondary | ICD-10-CM | POA: Diagnosis not present

## 2023-02-19 DIAGNOSIS — M5416 Radiculopathy, lumbar region: Secondary | ICD-10-CM | POA: Diagnosis not present

## 2023-02-21 DIAGNOSIS — M5416 Radiculopathy, lumbar region: Secondary | ICD-10-CM | POA: Diagnosis not present

## 2023-02-25 DIAGNOSIS — M5416 Radiculopathy, lumbar region: Secondary | ICD-10-CM | POA: Diagnosis not present

## 2023-02-27 DIAGNOSIS — M5416 Radiculopathy, lumbar region: Secondary | ICD-10-CM | POA: Diagnosis not present

## 2023-02-27 NOTE — Progress Notes (Signed)
Remote pacemaker transmission.   

## 2023-03-05 DIAGNOSIS — M5416 Radiculopathy, lumbar region: Secondary | ICD-10-CM | POA: Diagnosis not present

## 2023-03-07 DIAGNOSIS — M5416 Radiculopathy, lumbar region: Secondary | ICD-10-CM | POA: Diagnosis not present

## 2023-03-14 DIAGNOSIS — M5416 Radiculopathy, lumbar region: Secondary | ICD-10-CM | POA: Diagnosis not present

## 2023-03-19 DIAGNOSIS — M5416 Radiculopathy, lumbar region: Secondary | ICD-10-CM | POA: Diagnosis not present

## 2023-03-21 DIAGNOSIS — M5416 Radiculopathy, lumbar region: Secondary | ICD-10-CM | POA: Diagnosis not present

## 2023-03-26 DIAGNOSIS — M5416 Radiculopathy, lumbar region: Secondary | ICD-10-CM | POA: Diagnosis not present

## 2023-03-28 DIAGNOSIS — M5416 Radiculopathy, lumbar region: Secondary | ICD-10-CM | POA: Diagnosis not present

## 2023-04-02 DIAGNOSIS — M5416 Radiculopathy, lumbar region: Secondary | ICD-10-CM | POA: Diagnosis not present

## 2023-04-04 DIAGNOSIS — M5416 Radiculopathy, lumbar region: Secondary | ICD-10-CM | POA: Diagnosis not present

## 2023-04-11 DIAGNOSIS — M5416 Radiculopathy, lumbar region: Secondary | ICD-10-CM | POA: Diagnosis not present

## 2023-04-16 DIAGNOSIS — M5416 Radiculopathy, lumbar region: Secondary | ICD-10-CM | POA: Diagnosis not present

## 2023-04-18 DIAGNOSIS — M5416 Radiculopathy, lumbar region: Secondary | ICD-10-CM | POA: Diagnosis not present

## 2023-04-23 ENCOUNTER — Ambulatory Visit (INDEPENDENT_AMBULATORY_CARE_PROVIDER_SITE_OTHER): Payer: PPO

## 2023-04-23 DIAGNOSIS — I495 Sick sinus syndrome: Secondary | ICD-10-CM

## 2023-04-23 DIAGNOSIS — M5416 Radiculopathy, lumbar region: Secondary | ICD-10-CM | POA: Diagnosis not present

## 2023-04-24 LAB — CUP PACEART REMOTE DEVICE CHECK
Battery Remaining Longevity: 49 mo
Battery Remaining Percentage: 44 %
Battery Voltage: 2.96 V
Brady Statistic AP VP Percent: 4.4 %
Brady Statistic AP VS Percent: 89 %
Brady Statistic AS VP Percent: 1 %
Brady Statistic AS VS Percent: 6.3 %
Brady Statistic RA Percent Paced: 93 %
Brady Statistic RV Percent Paced: 4.4 %
Date Time Interrogation Session: 20240528020015
Implantable Lead Connection Status: 753985
Implantable Lead Connection Status: 753985
Implantable Lead Implant Date: 20180522
Implantable Lead Implant Date: 20180522
Implantable Lead Location: 753859
Implantable Lead Location: 753860
Implantable Pulse Generator Implant Date: 20180522
Lead Channel Impedance Value: 430 Ohm
Lead Channel Impedance Value: 440 Ohm
Lead Channel Pacing Threshold Amplitude: 0.5 V
Lead Channel Pacing Threshold Amplitude: 0.5 V
Lead Channel Pacing Threshold Pulse Width: 0.5 ms
Lead Channel Pacing Threshold Pulse Width: 0.5 ms
Lead Channel Sensing Intrinsic Amplitude: 1.7 mV
Lead Channel Sensing Intrinsic Amplitude: 7.6 mV
Lead Channel Setting Pacing Amplitude: 1.5 V
Lead Channel Setting Pacing Amplitude: 2.5 V
Lead Channel Setting Pacing Pulse Width: 0.5 ms
Lead Channel Setting Sensing Sensitivity: 2 mV
Pulse Gen Model: 2272
Pulse Gen Serial Number: 8908368

## 2023-04-25 DIAGNOSIS — M5416 Radiculopathy, lumbar region: Secondary | ICD-10-CM | POA: Diagnosis not present

## 2023-05-20 NOTE — Progress Notes (Signed)
Remote pacemaker transmission.   

## 2023-05-22 DIAGNOSIS — R432 Parageusia: Secondary | ICD-10-CM | POA: Diagnosis not present

## 2023-07-23 ENCOUNTER — Ambulatory Visit (INDEPENDENT_AMBULATORY_CARE_PROVIDER_SITE_OTHER): Payer: Self-pay

## 2023-07-23 DIAGNOSIS — I495 Sick sinus syndrome: Secondary | ICD-10-CM | POA: Diagnosis not present

## 2023-07-24 LAB — CUP PACEART REMOTE DEVICE CHECK
Battery Remaining Longevity: 46 mo
Battery Remaining Percentage: 41 %
Battery Voltage: 2.96 V
Brady Statistic AP VP Percent: 4.3 %
Brady Statistic AP VS Percent: 89 %
Brady Statistic AS VP Percent: 1 %
Brady Statistic AS VS Percent: 6.1 %
Brady Statistic RA Percent Paced: 93 %
Brady Statistic RV Percent Paced: 4.3 %
Date Time Interrogation Session: 20240827020014
Implantable Lead Connection Status: 753985
Implantable Lead Connection Status: 753985
Implantable Lead Implant Date: 20180522
Implantable Lead Implant Date: 20180522
Implantable Lead Location: 753859
Implantable Lead Location: 753860
Implantable Pulse Generator Implant Date: 20180522
Lead Channel Impedance Value: 410 Ohm
Lead Channel Impedance Value: 440 Ohm
Lead Channel Pacing Threshold Amplitude: 0.5 V
Lead Channel Pacing Threshold Amplitude: 0.5 V
Lead Channel Pacing Threshold Pulse Width: 0.5 ms
Lead Channel Pacing Threshold Pulse Width: 0.5 ms
Lead Channel Sensing Intrinsic Amplitude: 1.5 mV
Lead Channel Sensing Intrinsic Amplitude: 7.3 mV
Lead Channel Setting Pacing Amplitude: 1.5 V
Lead Channel Setting Pacing Amplitude: 2.5 V
Lead Channel Setting Pacing Pulse Width: 0.5 ms
Lead Channel Setting Sensing Sensitivity: 2 mV
Pulse Gen Model: 2272
Pulse Gen Serial Number: 8908368

## 2023-08-06 NOTE — Progress Notes (Signed)
Remote pacemaker transmission.   

## 2023-10-22 ENCOUNTER — Ambulatory Visit (INDEPENDENT_AMBULATORY_CARE_PROVIDER_SITE_OTHER): Payer: Self-pay

## 2023-10-22 DIAGNOSIS — I495 Sick sinus syndrome: Secondary | ICD-10-CM | POA: Diagnosis not present

## 2023-10-22 LAB — CUP PACEART REMOTE DEVICE CHECK
Battery Remaining Longevity: 43 mo
Battery Remaining Percentage: 39 %
Battery Voltage: 2.96 V
Brady Statistic AP VP Percent: 4.3 %
Brady Statistic AP VS Percent: 90 %
Brady Statistic AS VP Percent: 1 %
Brady Statistic AS VS Percent: 5.9 %
Brady Statistic RA Percent Paced: 94 %
Brady Statistic RV Percent Paced: 4.3 %
Date Time Interrogation Session: 20241126020014
Implantable Lead Connection Status: 753985
Implantable Lead Connection Status: 753985
Implantable Lead Implant Date: 20180522
Implantable Lead Implant Date: 20180522
Implantable Lead Location: 753859
Implantable Lead Location: 753860
Implantable Pulse Generator Implant Date: 20180522
Lead Channel Impedance Value: 410 Ohm
Lead Channel Impedance Value: 450 Ohm
Lead Channel Pacing Threshold Amplitude: 0.5 V
Lead Channel Pacing Threshold Amplitude: 0.5 V
Lead Channel Pacing Threshold Pulse Width: 0.5 ms
Lead Channel Pacing Threshold Pulse Width: 0.5 ms
Lead Channel Sensing Intrinsic Amplitude: 1.7 mV
Lead Channel Sensing Intrinsic Amplitude: 7.2 mV
Lead Channel Setting Pacing Amplitude: 1.5 V
Lead Channel Setting Pacing Amplitude: 2.5 V
Lead Channel Setting Pacing Pulse Width: 0.5 ms
Lead Channel Setting Sensing Sensitivity: 2 mV
Pulse Gen Model: 2272
Pulse Gen Serial Number: 8908368

## 2023-11-04 DIAGNOSIS — M542 Cervicalgia: Secondary | ICD-10-CM | POA: Diagnosis not present

## 2023-11-25 NOTE — Progress Notes (Signed)
Remote pacemaker transmission.   

## 2024-01-21 ENCOUNTER — Ambulatory Visit (INDEPENDENT_AMBULATORY_CARE_PROVIDER_SITE_OTHER): Payer: Self-pay

## 2024-01-21 DIAGNOSIS — I495 Sick sinus syndrome: Secondary | ICD-10-CM

## 2024-01-22 LAB — CUP PACEART REMOTE DEVICE CHECK
Battery Remaining Longevity: 41 mo
Battery Remaining Percentage: 37 %
Battery Voltage: 2.95 V
Brady Statistic AP VP Percent: 4.3 %
Brady Statistic AP VS Percent: 90 %
Brady Statistic AS VP Percent: 1 %
Brady Statistic AS VS Percent: 5.7 %
Brady Statistic RA Percent Paced: 94 %
Brady Statistic RV Percent Paced: 4.3 %
Date Time Interrogation Session: 20250225040034
Implantable Lead Connection Status: 753985
Implantable Lead Connection Status: 753985
Implantable Lead Implant Date: 20180522
Implantable Lead Implant Date: 20180522
Implantable Lead Location: 753859
Implantable Lead Location: 753860
Implantable Pulse Generator Implant Date: 20180522
Lead Channel Impedance Value: 390 Ohm
Lead Channel Impedance Value: 450 Ohm
Lead Channel Pacing Threshold Amplitude: 0.5 V
Lead Channel Pacing Threshold Amplitude: 0.5 V
Lead Channel Pacing Threshold Pulse Width: 0.5 ms
Lead Channel Pacing Threshold Pulse Width: 0.5 ms
Lead Channel Sensing Intrinsic Amplitude: 2.1 mV
Lead Channel Sensing Intrinsic Amplitude: 6.5 mV
Lead Channel Setting Pacing Amplitude: 1.5 V
Lead Channel Setting Pacing Amplitude: 2.5 V
Lead Channel Setting Pacing Pulse Width: 0.5 ms
Lead Channel Setting Sensing Sensitivity: 2 mV
Pulse Gen Model: 2272
Pulse Gen Serial Number: 8908368

## 2024-03-02 NOTE — Addendum Note (Signed)
 Addended by: Elease Etienne A on: 03/02/2024 11:09 AM   Modules accepted: Orders

## 2024-03-02 NOTE — Progress Notes (Signed)
 Remote pacemaker transmission.

## 2024-03-16 DIAGNOSIS — D509 Iron deficiency anemia, unspecified: Secondary | ICD-10-CM | POA: Diagnosis not present

## 2024-04-21 ENCOUNTER — Ambulatory Visit (INDEPENDENT_AMBULATORY_CARE_PROVIDER_SITE_OTHER): Payer: Self-pay

## 2024-04-21 DIAGNOSIS — I495 Sick sinus syndrome: Secondary | ICD-10-CM | POA: Diagnosis not present

## 2024-04-22 LAB — CUP PACEART REMOTE DEVICE CHECK
Battery Remaining Longevity: 38 mo
Battery Remaining Percentage: 34 %
Battery Voltage: 2.95 V
Brady Statistic AP VP Percent: 4.3 %
Brady Statistic AP VS Percent: 90 %
Brady Statistic AS VP Percent: 1 %
Brady Statistic AS VS Percent: 5.5 %
Brady Statistic RA Percent Paced: 94 %
Brady Statistic RV Percent Paced: 4.3 %
Date Time Interrogation Session: 20250527020018
Implantable Lead Connection Status: 753985
Implantable Lead Connection Status: 753985
Implantable Lead Implant Date: 20180522
Implantable Lead Implant Date: 20180522
Implantable Lead Location: 753859
Implantable Lead Location: 753860
Implantable Pulse Generator Implant Date: 20180522
Lead Channel Impedance Value: 410 Ohm
Lead Channel Impedance Value: 450 Ohm
Lead Channel Pacing Threshold Amplitude: 0.5 V
Lead Channel Pacing Threshold Amplitude: 0.625 V
Lead Channel Pacing Threshold Pulse Width: 0.5 ms
Lead Channel Pacing Threshold Pulse Width: 0.5 ms
Lead Channel Sensing Intrinsic Amplitude: 3 mV
Lead Channel Sensing Intrinsic Amplitude: 6.8 mV
Lead Channel Setting Pacing Amplitude: 1.625
Lead Channel Setting Pacing Amplitude: 2.5 V
Lead Channel Setting Pacing Pulse Width: 0.5 ms
Lead Channel Setting Sensing Sensitivity: 2 mV
Pulse Gen Model: 2272
Pulse Gen Serial Number: 8908368

## 2024-04-24 ENCOUNTER — Ambulatory Visit: Payer: Self-pay | Admitting: Cardiology

## 2024-05-25 ENCOUNTER — Telehealth: Payer: Self-pay | Admitting: *Deleted

## 2024-05-25 NOTE — Telephone Encounter (Signed)
 Alert received from [employer]:  Alert remote transmission:  HVR Event occurred 6/29 @ 07:28, duration 1hr , HR 178.  EGM c/w AF with RVR.  No OAC, due to low burden per EPIC.  Route to triage for long duration.   Presenting AP/VS Follow up as scheduled. __________________________________________________________________________________________  Message sent to EP scheduling since patient has not been seen in office since 08/25/19.  Per Dr. Inocencio note from 08/25/19. Patient preferred to not be on medications at that time.  Will route to [facility] for general awareness.

## 2024-05-27 NOTE — Telephone Encounter (Signed)
 Patient is scheduled to see Dr. Inocencio on 7/31. Spoke w/ Randall, RN who stated this is appropriate for patient.

## 2024-06-09 DIAGNOSIS — R432 Parageusia: Secondary | ICD-10-CM | POA: Diagnosis not present

## 2024-06-09 DIAGNOSIS — H6123 Impacted cerumen, bilateral: Secondary | ICD-10-CM | POA: Diagnosis not present

## 2024-06-09 DIAGNOSIS — E7800 Pure hypercholesterolemia, unspecified: Secondary | ICD-10-CM | POA: Diagnosis not present

## 2024-06-09 DIAGNOSIS — D509 Iron deficiency anemia, unspecified: Secondary | ICD-10-CM | POA: Diagnosis not present

## 2024-06-12 NOTE — Addendum Note (Signed)
 Addended by: TAWNI DRILLING D on: 06/12/2024 01:12 PM   Modules accepted: Orders

## 2024-06-12 NOTE — Progress Notes (Signed)
 Remote pacemaker transmission.

## 2024-06-25 ENCOUNTER — Ambulatory Visit: Attending: Cardiology | Admitting: Cardiology

## 2024-06-25 ENCOUNTER — Encounter: Payer: Self-pay | Admitting: Cardiology

## 2024-06-25 VITALS — BP 140/80 | HR 78 | Ht 68.0 in | Wt 138.5 lb

## 2024-06-25 DIAGNOSIS — I495 Sick sinus syndrome: Secondary | ICD-10-CM | POA: Diagnosis not present

## 2024-06-25 DIAGNOSIS — I493 Ventricular premature depolarization: Secondary | ICD-10-CM | POA: Diagnosis not present

## 2024-06-25 DIAGNOSIS — I480 Paroxysmal atrial fibrillation: Secondary | ICD-10-CM

## 2024-06-25 NOTE — Progress Notes (Signed)
  Electrophysiology Office Note:   Date:  06/25/2024  ID:  Johnathan [name], DOB 1946/07/03, MRN [id number]  Primary Cardiologist: Lynwood Schilling, MD Primary Heart Failure: None Electrophysiologist: Mehki Klumpp Gladis Norton, MD      History of Present Illness:   Johnathan [name] is a 78 y.o. male with h/o sick sinus syndrome, PVCs seen today for routine electrophysiology followup.   Since last being seen in our clinic the patient reports doing overall well.  He has no chest pain or shortness of breath.  He has been having some episodes of atrial fibrillation.  His longest episode is just over 3 hours.  He prefer to avoid any medications to treat this for now.  He does feel palpitations and chest discomfort when he is in atrial fibrillation his rates are quite rapid.  When he is in normal rhythm, he has no acute complaints..  he denies chest pain, palpitations, dyspnea, PND, orthopnea, nausea, vomiting, dizziness, syncope, edema, weight gain, or early satiety.   Review of systems complete and found to be negative unless listed in HPI.      EP Information / Studies Reviewed:    EKG is ordered today. Personal review as below.  EKG Interpretation Date/Time:  Thursday June 25 2024 14:09:17 EDT Ventricular Rate:  78 PR Interval:  160 QRS Duration:  96 QT Interval:  358 QTC Calculation: 408 R Axis:   2  Text Interpretation: Atrial-paced rhythm Low voltage QRS Nonspecific ST abnormality When compared with ECG of 14-Jul-2019 10:17, No significant change was found Confirmed by Shandricka Monroy ([id number]) on 06/25/2024 2:44:18 PM   PPM Interrogation-  reviewed in detail today,  See PACEART report.  Device History: Abbott Dual Chamber PPM implanted 04/16/17 for Sinus Node Dysfunction  Risk Assessment/Calculations:    CHA2DS2-VASc Score = 2   This indicates a 2.2% annual risk of stroke. The patient's score is based upon: CHF History: 0 HTN History: 0 Diabetes History: 0 Stroke History:  0 Vascular Disease History: 0 Age Score: 2 Gender Score: 0           Physical Exam:   VS:  BP (!) 140/80 (BP Location: Left Arm, Patient Position: Sitting, Cuff Size: Normal)   Pulse 78   Ht 5' 8 (1.727 m)   Wt 138 lb 8 oz (62.8 kg)   SpO2 96%   BMI 21.06 kg/m    Wt Readings from Last 3 Encounters:  06/25/24 138 lb 8 oz (62.8 kg)  08/26/19 129 lb 11.2 oz (58.8 kg)  08/25/19 130 lb 3.2 oz (59.1 kg)     GEN: Well nourished, well developed in no acute distress NECK: No JVD; No carotid bruits CARDIAC: Regular rate and rhythm, no murmurs, rubs, gallops RESPIRATORY:  Clear to auscultation without rales, wheezing or rhonchi  ABDOMEN: Soft, non-tender, non-distended EXTREMITIES:  No edema; No deformity   ASSESSMENT AND PLAN:    SND s/p Abbott PPM  Normal PPM function Sensing, threshold, impedance within normal limits Programming appropriate See Pace Art report No changes today  2.  PVCs: Minimal noted.  3.  Paroxysmal atrial fibrillation: Minimal symptoms and short-lived.  No changes at this time.  Currently not anticoagulated.  If he has any longer episodes, Eliquis would be a reasonable option.  If he wishes, would start either as needed or daily rate control.  Disposition:   Follow up with EP APP in 12 months  Signed, Malayzia Laforte Gladis Norton, MD

## 2024-06-26 ENCOUNTER — Encounter: Payer: Self-pay | Admitting: Cardiology

## 2024-06-26 DIAGNOSIS — Z79899 Other long term (current) drug therapy: Secondary | ICD-10-CM

## 2024-06-26 DIAGNOSIS — I480 Paroxysmal atrial fibrillation: Secondary | ICD-10-CM

## 2024-07-03 ENCOUNTER — Other Ambulatory Visit: Payer: Self-pay

## 2024-07-03 DIAGNOSIS — Z79899 Other long term (current) drug therapy: Secondary | ICD-10-CM | POA: Diagnosis not present

## 2024-07-03 DIAGNOSIS — I480 Paroxysmal atrial fibrillation: Secondary | ICD-10-CM | POA: Diagnosis not present

## 2024-07-04 LAB — BASIC METABOLIC PANEL WITH GFR
BUN/Creatinine Ratio: 16 (ref 10–24)
BUN: 14 mg/dL (ref 8–27)
CO2: 22 mmol/L (ref 20–29)
Calcium: 10.2 mg/dL (ref 8.6–10.2)
Chloride: 100 mmol/L (ref 96–106)
Creatinine, Ser: 0.88 mg/dL (ref 0.76–1.27)
Glucose: 103 mg/dL — ABNORMAL HIGH (ref 70–99)
Potassium: 4.6 mmol/L (ref 3.5–5.2)
Sodium: 140 mmol/L (ref 134–144)
eGFR: 89 mL/min/1.73 (ref >59)

## 2024-07-04 LAB — CBC
Hematocrit: 41.2 % (ref 37.5–51.0)
Hemoglobin: 13.5 g/dL (ref 13.0–17.7)
MCH: 32 pg (ref 26.6–33.0)
MCHC: 32.8 g/dL (ref 31.5–35.7)
MCV: 98 fL — ABNORMAL HIGH (ref 79–97)
Platelets: 216 x10E3/uL (ref 150–450)
RBC: 4.22 x10E6/uL (ref 4.14–5.80)
RDW: 12.9 % (ref 11.6–15.4)
WBC: 7.7 x10E3/uL (ref 3.4–10.8)

## 2024-07-06 ENCOUNTER — Other Ambulatory Visit: Payer: Self-pay

## 2024-07-06 MED ORDER — DILTIAZEM HCL 30 MG PO TABS: 30.0000 mg | ORAL_TABLET | ORAL | 5 refills | Status: AC | PRN

## 2024-07-21 ENCOUNTER — Ambulatory Visit (INDEPENDENT_AMBULATORY_CARE_PROVIDER_SITE_OTHER): Payer: Self-pay

## 2024-07-21 ENCOUNTER — Telehealth: Payer: Self-pay | Admitting: *Deleted

## 2024-07-21 DIAGNOSIS — I495 Sick sinus syndrome: Secondary | ICD-10-CM

## 2024-07-21 NOTE — Telephone Encounter (Signed)
 Dr. Inocencio I called pt to start Eliquis after obtaining lab work.  Patient was confused and thought the Diltiazem  PRN was the only medication he was to start.  I explained to pt the reasoning for the initial blood work.  Patient does not like to take medication (I have a sticky note in chart from 2021 about his resistance to taking medication), and is asking if taking a daily medication is necessary with short episodes.  Your note seems to reflect can hold off unless longer lasting episodes -- please advise if pt can hold off on starting OAC for now...SABRASABRA

## 2024-07-22 ENCOUNTER — Ambulatory Visit: Payer: Self-pay | Admitting: Cardiology

## 2024-07-22 LAB — CUP PACEART REMOTE DEVICE CHECK
Battery Remaining Longevity: 35 mo
Battery Remaining Percentage: 32 %
Battery Voltage: 2.93 V
Brady Statistic AP VP Percent: 3.8 %
Brady Statistic AP VS Percent: 95 %
Brady Statistic AS VP Percent: 1 %
Brady Statistic AS VS Percent: 1.6 %
Brady Statistic RA Percent Paced: 98 %
Brady Statistic RV Percent Paced: 3.8 %
Date Time Interrogation Session: 20250826020019
Implantable Lead Connection Status: 753985
Implantable Lead Connection Status: 753985
Implantable Lead Implant Date: 20180522
Implantable Lead Implant Date: 20180522
Implantable Lead Location: 753859
Implantable Lead Location: 753860
Implantable Pulse Generator Implant Date: 20180522
Lead Channel Impedance Value: 430 Ohm
Lead Channel Impedance Value: 450 Ohm
Lead Channel Pacing Threshold Amplitude: 0.625 V
Lead Channel Pacing Threshold Amplitude: 0.75 V
Lead Channel Pacing Threshold Pulse Width: 0.5 ms
Lead Channel Pacing Threshold Pulse Width: 0.5 ms
Lead Channel Sensing Intrinsic Amplitude: 1 mV
Lead Channel Sensing Intrinsic Amplitude: 6.7 mV
Lead Channel Setting Pacing Amplitude: 1.625
Lead Channel Setting Pacing Amplitude: 2.5 V
Lead Channel Setting Pacing Pulse Width: 0.5 ms
Lead Channel Setting Sensing Sensitivity: 2 mV
Pulse Gen Model: 2272
Pulse Gen Serial Number: 8908368

## 2024-07-22 NOTE — Telephone Encounter (Signed)
 Pt advised of MD recommendation.  Pt will update us  if starts having more frequent and/or long lasting episodes to discuss if need to start OAC. Patient verbalized understanding and agreeable to plan.

## 2024-08-12 NOTE — Progress Notes (Signed)
 Remote PPM Transmission

## 2024-10-20 ENCOUNTER — Ambulatory Visit: Payer: Self-pay

## 2024-10-20 DIAGNOSIS — I495 Sick sinus syndrome: Secondary | ICD-10-CM

## 2024-10-21 LAB — CUP PACEART REMOTE DEVICE CHECK
Battery Remaining Longevity: 33 mo
Battery Remaining Percentage: 29 %
Battery Voltage: 2.92 V
Brady Statistic AP VP Percent: 3.8 %
Brady Statistic AP VS Percent: 94 %
Brady Statistic AS VP Percent: 1 %
Brady Statistic AS VS Percent: 2.4 %
Brady Statistic RA Percent Paced: 97 %
Brady Statistic RV Percent Paced: 3.8 %
Date Time Interrogation Session: 20251125020014
Implantable Lead Connection Status: 753985
Implantable Lead Connection Status: 753985
Implantable Lead Implant Date: 20180522
Implantable Lead Implant Date: 20180522
Implantable Lead Location: 753859
Implantable Lead Location: 753860
Implantable Pulse Generator Implant Date: 20180522
Lead Channel Impedance Value: 430 Ohm
Lead Channel Impedance Value: 440 Ohm
Lead Channel Pacing Threshold Amplitude: 0.5 V
Lead Channel Pacing Threshold Amplitude: 0.75 V
Lead Channel Pacing Threshold Pulse Width: 0.5 ms
Lead Channel Pacing Threshold Pulse Width: 0.5 ms
Lead Channel Sensing Intrinsic Amplitude: 0.8 mV
Lead Channel Sensing Intrinsic Amplitude: 6.4 mV
Lead Channel Setting Pacing Amplitude: 1.5 V
Lead Channel Setting Pacing Amplitude: 2.5 V
Lead Channel Setting Pacing Pulse Width: 0.5 ms
Lead Channel Setting Sensing Sensitivity: 2 mV
Pulse Gen Model: 2272
Pulse Gen Serial Number: 8908368

## 2024-10-23 NOTE — Progress Notes (Signed)
 Remote PPM Transmission

## 2024-11-04 ENCOUNTER — Ambulatory Visit: Payer: Self-pay | Admitting: Cardiology

## 2024-12-18 ENCOUNTER — Other Ambulatory Visit (HOSPITAL_COMMUNITY): Payer: Self-pay | Admitting: Family Medicine

## 2024-12-18 DIAGNOSIS — M7989 Other specified soft tissue disorders: Secondary | ICD-10-CM

## 2024-12-21 ENCOUNTER — Ambulatory Visit (HOSPITAL_BASED_OUTPATIENT_CLINIC_OR_DEPARTMENT_OTHER)

## 2024-12-25 ENCOUNTER — Ambulatory Visit (HOSPITAL_BASED_OUTPATIENT_CLINIC_OR_DEPARTMENT_OTHER)
Admission: RE | Admit: 2024-12-25 | Discharge: 2024-12-25 | Disposition: A | Source: Ambulatory Visit | Attending: Family Medicine | Admitting: Family Medicine

## 2024-12-25 DIAGNOSIS — M7989 Other specified soft tissue disorders: Secondary | ICD-10-CM | POA: Diagnosis present

## 2025-01-19 ENCOUNTER — Ambulatory Visit: Payer: Self-pay

## 2025-04-20 ENCOUNTER — Ambulatory Visit: Payer: Self-pay

## 2025-07-20 ENCOUNTER — Ambulatory Visit: Payer: Self-pay
